# Patient Record
Sex: Female | Born: 1985 | Race: White | Hispanic: No | Marital: Married | Smoking: Former smoker
Health system: Southern US, Community
[De-identification: ages and names within clinical notes are randomized; demographics above are authoritative.]

## PROBLEM LIST (undated history)

## (undated) DIAGNOSIS — F419 Anxiety disorder, unspecified: Secondary | ICD-10-CM

## (undated) DIAGNOSIS — F429 Obsessive-compulsive disorder, unspecified: Secondary | ICD-10-CM

## (undated) DIAGNOSIS — F909 Attention-deficit hyperactivity disorder, unspecified type: Secondary | ICD-10-CM

## (undated) DIAGNOSIS — F32A Depression, unspecified: Secondary | ICD-10-CM

## (undated) HISTORY — DX: Attention-deficit hyperactivity disorder, unspecified type: F90.9

## (undated) HISTORY — DX: Anxiety disorder, unspecified: F41.9

## (undated) HISTORY — PX: TONSILLECTOMY: SUR1361

## (undated) HISTORY — DX: Obsessive-compulsive disorder, unspecified: F42.9

---

## 2020-07-01 ENCOUNTER — Encounter: Payer: Self-pay | Admitting: Emergency Medicine

## 2020-07-01 ENCOUNTER — Emergency Department
Admission: EM | Admit: 2020-07-01 | Discharge: 2020-07-01 | Disposition: A | Discharge status: Home / Self Care | Payer: Medicaid Other | Attending: Emergency Medicine | Admitting: Emergency Medicine

## 2020-07-01 ENCOUNTER — Other Ambulatory Visit: Payer: Self-pay

## 2020-07-01 ENCOUNTER — Emergency Department: Payer: Medicaid Other

## 2020-07-01 DIAGNOSIS — O200 Threatened abortion: Secondary | ICD-10-CM | POA: Diagnosis not present

## 2020-07-01 DIAGNOSIS — Z3A01 Less than 8 weeks gestation of pregnancy: Secondary | ICD-10-CM | POA: Diagnosis not present

## 2020-07-01 DIAGNOSIS — Z2913 Encounter for prophylactic Rho(D) immune globulin: Secondary | ICD-10-CM | POA: Diagnosis not present

## 2020-07-01 DIAGNOSIS — O209 Hemorrhage in early pregnancy, unspecified: Secondary | ICD-10-CM

## 2020-07-01 DIAGNOSIS — Z6741 Type O blood, Rh negative: Secondary | ICD-10-CM | POA: Diagnosis not present

## 2020-07-01 DIAGNOSIS — O208 Other hemorrhage in early pregnancy: Secondary | ICD-10-CM | POA: Diagnosis not present

## 2020-07-01 DIAGNOSIS — R1032 Left lower quadrant pain: Secondary | ICD-10-CM | POA: Diagnosis not present

## 2020-07-01 DIAGNOSIS — Z3A08 8 weeks gestation of pregnancy: Secondary | ICD-10-CM | POA: Diagnosis not present

## 2020-07-01 HISTORY — DX: Depression, unspecified: F32.A

## 2020-07-01 LAB — CBC
HCT: 34.4 % — ABNORMAL LOW (ref 36.0–46.0)
Hemoglobin: 11.3 g/dL — ABNORMAL LOW (ref 12.0–15.0)
MCH: 30.4 pg (ref 26.0–34.0)
MCHC: 32.8 g/dL (ref 30.0–36.0)
MCV: 92.5 fL (ref 80.0–100.0)
Platelets: 224 10*3/uL (ref 150–400)
RBC: 3.72 MIL/uL — ABNORMAL LOW (ref 3.87–5.11)
RDW: 13.6 % (ref 11.5–15.5)
WBC: 8.9 10*3/uL (ref 4.0–10.5)
nRBC: 0 % (ref 0.0–0.2)

## 2020-07-01 LAB — BASIC METABOLIC PANEL
Anion gap: 9 (ref 5–15)
BUN: 15 mg/dL (ref 6–20)
CO2: 23 mmol/L (ref 22–32)
Calcium: 9.2 mg/dL (ref 8.9–10.3)
Chloride: 103 mmol/L (ref 98–111)
Creatinine, Ser: 0.58 mg/dL (ref 0.44–1.00)
GFR, Estimated: >60 mL/min (ref >60)
Glucose, Bld: 92 mg/dL (ref 70–99)
Potassium: 4.1 mmol/L (ref 3.5–5.1)
Sodium: 135 mmol/L (ref 135–145)

## 2020-07-01 LAB — CHLAMYDIA/NGC RT PCR (ARMC ONLY)
Chlamydia Tr: NOT DETECTED
N gonorrhoeae: NOT DETECTED

## 2020-07-01 LAB — WET PREP, GENITAL
Sperm: NONE SEEN
Trich, Wet Prep: NONE SEEN
Yeast Wet Prep HPF POC: NONE SEEN

## 2020-07-01 LAB — URINALYSIS, ROUTINE W REFLEX MICROSCOPIC
Bacteria, UA: NONE SEEN
Bilirubin Urine: NEGATIVE
Glucose, UA: NEGATIVE mg/dL
Ketones, ur: NEGATIVE mg/dL
Leukocytes,Ua: NEGATIVE
Nitrite: NEGATIVE
Protein, ur: NEGATIVE mg/dL
Specific Gravity, Urine: 1.012 (ref 1.005–1.030)
pH: 5 (ref 5.0–8.0)

## 2020-07-01 LAB — POC URINE PREG, ED: Preg Test, Ur: POSITIVE — AB

## 2020-07-01 LAB — HCG, QUANTITATIVE, PREGNANCY: hCG, Beta Chain, Quant, S: 143460 m[IU]/mL — ABNORMAL HIGH (ref <5)

## 2020-07-01 LAB — PREGNANCY, URINE: Preg Test, Ur: POSITIVE — AB

## 2020-07-01 LAB — ABO/RH: ABO/RH(D): O NEG

## 2020-07-01 LAB — ANTIBODY SCREEN: Antibody Screen: NEGATIVE

## 2020-07-01 MED ORDER — RHO D IMMUNE GLOBULIN 1500 UNIT/2ML IJ SOSY
300.0000 ug | PREFILLED_SYRINGE | Freq: Once | INTRAMUSCULAR | Status: DC
  Filled 2020-07-01: qty 2

## 2020-07-01 MED ORDER — ONDANSETRON 4 MG PO TBDP: 4.0000 mg | ORAL_TABLET | Freq: Three times a day (TID) | ORAL | 0 refills | Status: DC | PRN

## 2020-07-01 MED ORDER — RHO D IMMUNE GLOBULIN 1500 UNIT/2ML IJ SOSY
300.0000 ug | PREFILLED_SYRINGE | Freq: Once | INTRAMUSCULAR | Status: AC
  Administered 2020-07-01: 300 ug via INTRAMUSCULAR
  Filled 2020-07-01: qty 2

## 2020-07-01 NOTE — ED Triage Notes (Signed)
Pt to ER states recent positive pregnancy test.  States this am sudden sharp pain and noted vaginal bleeding.  Pt states blood with urination and brownish discharge.  Pt states no bleeding since that time.

## 2020-07-01 NOTE — ED Provider Notes (Signed)
[facility] Emergency Department Provider Note   ____________________________________________   First MD Initiated Contact with Patient 07/01/20 [phone]     (approximate)  I have reviewed the triage vital signs and the nursing notes.   HISTORY  Chief Complaint Vaginal Bleeding    HPI Erica Ramirez is a 34 y.o. female with no significant past medical history who is G6 P2-0-3-2 at approximately 8 weeks of pregnancy and presents to the ED complaining of vaginal bleeding.  Patient reports that she found out she was pregnant about 2 weeks ago with a home pregnancy test.  This morning, she woke up with sharp pain in the left lower quadrant of her abdomen and when she went to the bathroom noticed that she was having vaginal bleeding.  She states that vaginal bleeding has seemed to ease up since then, but she did notice passage of some brownish tissue.  Abdominal pain has also improved, she denies any dysuria, hematuria, fevers, nausea, vomiting, or changes in bowel movements.  She has not yet seen an OB/GYN this pregnancy or had an ultrasound.  She reports her blood type is O- and she has required RhoGam in the past.        Past Medical History:  Diagnosis Date  . Depression     There are no problems to display for this patient.   History reviewed. No pertinent surgical history.  Prior to Admission medications   Not on File    Allergies Ibuprofen and Tramadol  History reviewed. No pertinent family history.  Social History Social History   Tobacco Use  . Smoking status: Never Smoker  . Smokeless tobacco: Never Used  Substance Use Topics  . Alcohol use: Not on file  . Drug use: Not on file    Review of Systems  Constitutional: No fever/chills Eyes: No visual changes. ENT: No sore throat. Cardiovascular: Denies chest pain. Respiratory: Denies shortness of breath. Gastrointestinal: Positive for abdominal pain.  No nausea, no vomiting.   No diarrhea.  No constipation. Genitourinary: Negative for dysuria.  Positive for vaginal bleeding. Musculoskeletal: Negative for back pain. Skin: Negative for rash. Neurological: Negative for headaches, focal weakness or numbness.  ____________________________________________   PHYSICAL EXAM:  VITAL SIGNS: ED Triage Vitals  Enc Vitals Group     BP 07/01/20 0901 (!) 111/55     Pulse Rate 07/01/20 0901 (!) 59     Resp 07/01/20 0901 16     Temp 07/01/20 0901 98.2 F (36.8 C)     Temp Source 07/01/20 0901 Oral     SpO2 07/01/20 0901 99 %     Weight 07/01/20 0900 105 lb (47.6 kg)     Height 07/01/20 0900 5\' 7"  (1.702 m)     Head Circumference --      Peak Flow --      Pain Score 07/01/20 0858 0     Pain Loc --      Pain Edu? --      Excl. in [place]? --     Constitutional: Alert and oriented. Eyes: Conjunctivae are normal. Head: Atraumatic. Nose: No congestion/rhinnorhea. Mouth/Throat: Mucous membranes are moist. Neck: Normal ROM Cardiovascular: Normal rate, regular rhythm. Grossly normal heart sounds. Respiratory: Normal respiratory effort.  No retractions. Lungs CTAB. Gastrointestinal: Soft and nontender. No distention. Genitourinary: Cervical os closed, no bleeding or discharge noted, no cervical motion or adnexal tenderness. Musculoskeletal: No lower extremity tenderness nor edema. Neurologic:  Normal speech and language. No gross focal neurologic deficits are  appreciated. Skin:  Skin is warm, dry and intact. No rash noted. Psychiatric: Mood and affect are normal. Speech and behavior are normal.  ____________________________________________   LABS (all labs ordered are listed, but only abnormal results are displayed)  Labs Reviewed  WET PREP, GENITAL - Abnormal; Notable for the following components:      Result Value   Clue Cells Wet Prep HPF POC PRESENT (*)    WBC, Wet Prep HPF POC MODERATE (*)    All other components within normal limits  CBC - Abnormal; Notable  for the following components:   RBC 3.72 (*)    Hemoglobin 11.3 (*)    HCT 34.4 (*)    All other components within normal limits  URINALYSIS, ROUTINE W REFLEX MICROSCOPIC - Abnormal; Notable for the following components:   Color, Urine YELLOW (*)    APPearance CLEAR (*)    Hgb urine dipstick SMALL (*)    All other components within normal limits  PREGNANCY, URINE - Abnormal; Notable for the following components:   Preg Test, Ur POSITIVE (*)    All other components within normal limits  HCG, QUANTITATIVE, PREGNANCY - Abnormal; Notable for the following components:   hCG, Beta Chain, Quant, S 143,460 (*)    All other components within normal limits  CHLAMYDIA/NGC RT PCR ([facility] ONLY)  BASIC METABOLIC PANEL  POC URINE PREG, ED  POC URINE PREG, ED  ABO/RH  ANTIBODY SCREEN  RHOGAM INJECTION    PROCEDURES  Procedure(s) performed (including Critical Care):  Procedures   ____________________________________________   INITIAL IMPRESSION / ASSESSMENT AND PLAN / ED COURSE       34 year old female, G6 P2-0-3-2 at approximately 8 weeks of pregnancy, presents to the ED complaining of abdominal pain and vaginal bleeding starting this morning after she found out she was pregnant [redacted] weeks ago.  Patient has no focal tenderness on abdominal exam, we will further assess with pelvic exam.  Beta hCG levels are pending and we will further assess with ultrasound for ectopic pregnancy versus miscarriage.  Labs and UA are unremarkable thus far, patient states pain has resolved.  We will confirm her Rh- status, anticipate treatment with RhoGam.  Ultrasound shows intrauterine pregnancy at approximately 8 weeks with small subchorionic hemorrhage.  This is consistent with a threatened miscarriage, patient has not had any recurrence of pain or bleeding since arrival in the ED.  We will treat with RhoGam given her Rh- status, after which she will be appropriate for discharge home.  She was counseled  to establish care with OB/GYN and to return to the ED for new worsening symptoms.  Patient agrees with plan.      ____________________________________________   FINAL CLINICAL IMPRESSION(S) / ED DIAGNOSES  Final diagnoses:  Threatened miscarriage     ED Discharge Orders    None       Note:  This document was prepared using Dragon voice recognition software and may include unintentional dictation errors.   Blake Divine, MD 07/01/20 1331

## 2020-07-02 LAB — RHOGAM INJECTION: Unit division: 0

## 2020-07-03 ENCOUNTER — Other Ambulatory Visit: Payer: Self-pay

## 2020-07-03 ENCOUNTER — Encounter: Payer: Self-pay | Admitting: Obstetrics and Gynecology

## 2020-07-03 ENCOUNTER — Ambulatory Visit (INDEPENDENT_AMBULATORY_CARE_PROVIDER_SITE_OTHER): Payer: Medicaid Other | Admitting: Obstetrics and Gynecology

## 2020-07-03 VITALS — BP 98/63 | Ht 67.0 in | Wt 113.4 lb

## 2020-07-03 DIAGNOSIS — O200 Threatened abortion: Secondary | ICD-10-CM | POA: Diagnosis not present

## 2020-07-03 DIAGNOSIS — F32A Depression, unspecified: Secondary | ICD-10-CM

## 2020-07-03 DIAGNOSIS — F419 Anxiety disorder, unspecified: Secondary | ICD-10-CM

## 2020-07-03 NOTE — Progress Notes (Signed)
Obstetrics & Gynecology Office Visit   Chief Complaint  Patient presents with  . Gynecologic Exam    follow up ER threaten miscarriage    History of Present Illness: 34 y.o. [id number] at about 100w4d gestation (by 8 week u/s on 07/02/20) who presents in follow up from a visit to the ER. She had vaginal bleeding as her reason for presenting. She received rhogam due to her Rh negative status. She had a mucus discharge that was blood tinged. When she went to urinate she noted a pure stream of blood.  Then the bleeding subsided.  She has had pain on her left lower quadrant that was a straight, shooting sharp pain. The pain didn't last long at all. She has had no more bleeding.  She had an ultrasound in the ER that didn't show any cysts on her ovaries.  She also had some left shoulder pain after the ER.  Her appetite is very low right now.  She has trouble with eating in that she doesn't want to eat. She does drink water and no other liquids. She is eating fruits and vegetables. She is not taking a PNV, she has never taken them. She is taking Zofran for nausea. She was using marijuana to treat her depression in place of [place] for her depression. The medication made her dizzy.    Past Medical History:  Diagnosis Date  . ADHD   . Anxiety   . Depression   . OCD (obsessive compulsive disorder)     Past Surgical History:  Procedure Laterality Date  . TONSILLECTOMY      Gynecologic History: Patient's last menstrual period was 05/02/2020.  Obstetric History: G1P0  Family History  Problem Relation Age of Onset  . Lung cancer Maternal Grandmother   . Colon cancer Mother   . Ovarian cancer Sister        unsure if actually diagnosed    Social History   Socioeconomic History  . Marital status: Married    Spouse name: Not on file  . Number of children: Not on file  . Years of education: Not on file  . Highest education level: Not on file  Occupational History  . Not on file  Tobacco  Use  . Smoking status: Former Smoker    Types: Cigarettes  . Smokeless tobacco: Never Used  Vaping Use  . Vaping Use: Never used  Substance and Sexual Activity  . Alcohol use: Not Currently  . Drug use: Not Currently  . Sexual activity: Yes    Birth control/protection: None  Other Topics Concern  . Not on file  Social History Narrative  . Not on file   Social Determinants of Health   Financial Resource Strain:   . Difficulty of Paying Living Expenses: Not on file  Food Insecurity:   . Worried About Charity fundraiser in the Last Year: Not on file  . Ran Out of Food in the Last Year: Not on file  Transportation Needs:   . Lack of Transportation (Medical): Not on file  . Lack of Transportation (Non-Medical): Not on file  Physical Activity:   . Days of Exercise per Week: Not on file  . Minutes of Exercise per Session: Not on file  Stress:   . Feeling of Stress : Not on file  Social Connections:   . Frequency of Communication with Friends and Family: Not on file  . Frequency of Social Gatherings with Friends and Family: Not on file  . Attends Religious  Services: Not on file  . Active Member of Clubs or Organizations: Not on file  . Attends Archivist Meetings: Not on file  . Marital Status: Not on file  Intimate Partner Violence:   . Fear of Current or Ex-Partner: Not on file  . Emotionally Abused: Not on file  . Physically Abused: Not on file  . Sexually Abused: Not on file    Allergies  Allergen Reactions  . Ibuprofen Other (See Comments)    Increased bleeding from external hemr.  . Tramadol Itching    Prior to Admission medications   Medication Sig Start Date End Date Taking? Authorizing Provider  ondansetron (ZOFRAN ODT) 4 MG disintegrating tablet Take 1 tablet (4 mg total) by mouth every 8 (eight) hours as needed for nausea or vomiting. 07/01/20   Blake Divine, MD    Review of Systems  Constitutional: Negative.   HENT: Negative.   Eyes:  Negative.   Respiratory: Negative.   Cardiovascular: Negative.   Gastrointestinal: Negative.   Genitourinary: Negative.   Musculoskeletal: Negative.   Skin: Negative.   Neurological: Negative.   Psychiatric/Behavioral: Negative.      Physical Exam BP 98/63   Ht 5\' 7"  (1.702 m)   Wt 113 lb 6.4 oz (51.4 kg)   LMP 05/02/2020   BMI 17.76 kg/m  Patient's last menstrual period was 05/02/2020. Physical Exam Constitutional:      General: She is not in acute distress.    Appearance: Normal appearance.  HENT:     Head: Normocephalic and atraumatic.  Eyes:     General: No scleral icterus.    Conjunctiva/sclera: Conjunctivae normal.  Neurological:     General: No focal deficit present.     Mental Status: She is alert and oriented to person, place, and time.     Cranial Nerves: No cranial nerve deficit.  Psychiatric:        Mood and Affect: Mood normal.        Behavior: Behavior normal.        Judgment: Judgment normal.    Female chaperone present for pelvic and breast  portions of the physical exam  Assessment: 34 y.o. G1P0 female here for  1. Threatened abortion   2. Anxiety and depression      Plan: Problem List Items Addressed This Visit    None    Visit Diagnoses    Threatened abortion    -  Primary   Relevant Orders   US OB Comp Less 14 Wks   Ambulatory referral to Psychiatry   Anxiety and depression       Relevant Orders   Ambulatory referral to Psychiatry     Threatened abortion: First trimester bleeding - incidence and clinical course of first trimester bleeding is discussed in detail with the patient today.  Approximately 1/3 of pregnancies ending in live births experienced 1st trimester bleeding.  The amount of bleeding is variable and not necessarily predictive of outcome.  Sources may be cervical or uterine.  Subchorionic hemorrhages are a frequent concurrent findings on ultrasound and are followed expectantly.  These often absorb or regress spontaneously  although risk for expansion and further disruption of the utero-placental interface leading to miscarriage is possible.  There is no clearly documented benefit to limiting or modifying activity and sexual intercourse in altering clinic course of 1st trimester bleeding.   Will get her set up for prenatal care and will get an ultrasound as part of that care.   She does have  anxiety and depression. She has taken [place] and another medication during pregnancy. She has tried Zoloft in the past, which led to suicidal thoughts.  She has also tried Xanax, which she didn't like due to loss of memory. She had access to therapy when she lived in [place].  She went to [facility] in [place]. She might try to get back in with that organization.    A total of 30 minutes were spent face-to-face with the patient as well as preparation, review, communication, and documentation during this encounter.    Prentice Docker, MD 07/03/2020 9:48 AM

## 2020-07-11 ENCOUNTER — Other Ambulatory Visit: Payer: Self-pay

## 2020-07-11 ENCOUNTER — Encounter: Payer: Self-pay | Admitting: Advanced Practice Midwife

## 2020-07-11 ENCOUNTER — Ambulatory Visit (INDEPENDENT_AMBULATORY_CARE_PROVIDER_SITE_OTHER): Payer: Medicaid Other | Admitting: Advanced Practice Midwife

## 2020-07-11 ENCOUNTER — Other Ambulatory Visit (HOSPITAL_COMMUNITY)
Admission: RE | Admit: 2020-07-11 | Discharge: 2020-07-11 | Disposition: A | Discharge status: Home / Self Care | Payer: Medicaid Other | Source: Ambulatory Visit | Attending: Advanced Practice Midwife | Admitting: Advanced Practice Midwife

## 2020-07-11 ENCOUNTER — Ambulatory Visit (INDEPENDENT_AMBULATORY_CARE_PROVIDER_SITE_OTHER): Payer: Medicaid Other

## 2020-07-11 VITALS — BP 100/60 | Wt 121.0 lb

## 2020-07-11 DIAGNOSIS — O26891 Other specified pregnancy related conditions, first trimester: Secondary | ICD-10-CM | POA: Insufficient documentation

## 2020-07-11 DIAGNOSIS — Z3480 Encounter for supervision of other normal pregnancy, unspecified trimester: Secondary | ICD-10-CM | POA: Insufficient documentation

## 2020-07-11 DIAGNOSIS — Z113 Encounter for screening for infections with a predominantly sexual mode of transmission: Secondary | ICD-10-CM | POA: Diagnosis not present

## 2020-07-11 DIAGNOSIS — Z3A09 9 weeks gestation of pregnancy: Secondary | ICD-10-CM

## 2020-07-11 DIAGNOSIS — Z3481 Encounter for supervision of other normal pregnancy, first trimester: Secondary | ICD-10-CM | POA: Diagnosis not present

## 2020-07-11 DIAGNOSIS — O200 Threatened abortion: Secondary | ICD-10-CM | POA: Diagnosis not present

## 2020-07-11 DIAGNOSIS — Z3402 Encounter for supervision of normal first pregnancy, second trimester: Secondary | ICD-10-CM | POA: Insufficient documentation

## 2020-07-11 DIAGNOSIS — Z3491 Encounter for supervision of normal pregnancy, unspecified, first trimester: Secondary | ICD-10-CM | POA: Insufficient documentation

## 2020-07-11 LAB — POCT URINALYSIS DIPSTICK OB
Glucose, UA: NEGATIVE
POC,PROTEIN,UA: NEGATIVE

## 2020-07-11 NOTE — Progress Notes (Signed)
NOB/Dating, Viaility Scan- no new concerns since last visit

## 2020-07-11 NOTE — Patient Instructions (Signed)
Exercise During Pregnancy Exercise is an important part of being healthy for people of all ages. Exercise improves the function of your heart and lungs and helps you maintain strength, flexibility, and a healthy body weight. Exercise also boosts energy levels and elevates mood. Most women should exercise regularly during pregnancy. In rare cases, women with certain medical conditions or complications may be asked to limit or avoid exercise during pregnancy. How does this affect me? Along with maintaining general strength and flexibility, exercising during pregnancy can help:  Keep strength in muscles that are used during labor and childbirth.  Decrease low back pain.  Reduce symptoms of depression.  Control weight gain during pregnancy.  Reduce the risk of needing insulin if you develop diabetes during pregnancy.  Decrease the risk of cesarean delivery.  Speed up your recovery after giving birth. How does this affect my baby? Exercise can help you have a healthy pregnancy. Exercise does not cause premature birth. It will not cause your baby to weigh less at birth. What exercises can I do? Many exercises are safe for you to do during pregnancy. Do a variety of exercises that safely increase your heart and breathing rates and help you build and maintain muscle strength. Do exercises exactly as told by your health care provider. You may do these exercises:  Walking or hiking.  Swimming.  Water aerobics.  Riding a stationary bike.  Strength training.  Modified yoga or Pilates. Tell your instructor that you are pregnant. Avoid overstretching, and avoid lying on your back for long periods of time.  Running or jogging. Only choose this type of exercise if you: ? Ran or jogged regularly before your pregnancy. ? Can run or jog and still talk in complete sentences. What exercises should I avoid? Depending on your level of fitness and whether you exercised regularly before your  pregnancy, you may be told to limit high-intensity exercise. You can tell that you are exercising at a high intensity if you are breathing much harder and faster and cannot hold a conversation while exercising. You must avoid:  Contact sports.  Activities that put you at risk for falling on or being hit in the belly, such as downhill skiing, water skiing, surfing, rock climbing, cycling, gymnastics, and horseback riding.  Scuba diving.  Skydiving.  Yoga or Pilates in a room that is heated to high temperatures.  Jogging or running, unless you ran or jogged regularly before your pregnancy. While jogging or running, you should always be able to talk in full sentences. Do not run or jog so fast that you are unable to have a conversation.  Do not exercise at more than 6,000 feet above sea level (high elevation) if you are not used to exercising at high elevation. How do I exercise in a safe way?   Avoid overheating. Do not exercise in very high temperatures.  Wear loose-fitting, breathable clothes.  Avoid dehydration. Drink enough water before, during, and after exercise to keep your urine pale yellow.  Avoid overstretching. Because of hormone changes during pregnancy, it is easy to overstretch muscles, tendons, and ligaments during pregnancy.  Start slowly and ask your health care provider to recommend the types of exercise that are safe for you.  Do not exercise to lose weight. Follow these instructions at home:  Exercise on most days or all days of the week. Try to exercise for 30 minutes a day, 5 days a week, unless your health care provider tells you not to.  If  you actively exercised before your pregnancy and you are healthy, your health care provider may tell you to continue to do moderate to high-intensity exercise.  If you are just starting to exercise or did not exercise much before your pregnancy, your health care provider may tell you to do low to moderate-intensity  exercise. Questions to ask your health care provider  Is exercise safe for me?  What are signs that I should stop exercising?  Does my health condition mean that I should not exercise during pregnancy?  When should I avoid exercising during pregnancy? Stop exercising and contact a health care provider if: You have any unusual symptoms, such as:  Mild contractions of the uterus or cramps in the abdomen.  Dizziness that does not go away when you rest. Stop exercising and get help right away if: You have any unusual symptoms, such as:  Sudden, severe pain in your low back or your belly.  Mild contractions of the uterus or cramps in the abdomen that do not improve with rest and drinking fluids.  Chest pain.  Bleeding or fluid leaking from your vagina.  Shortness of breath. These symptoms may represent a serious problem that is an emergency. Do not wait to see if the symptoms will go away. Get medical help right away. Call your local emergency services (911 in the [place] Do not drive yourself to the hospital. Summary  Most women should exercise regularly throughout pregnancy. In rare cases, women with certain medical conditions or complications may be asked to limit or avoid exercise during pregnancy.  Do not exercise to lose weight during pregnancy.  Your health care provider will tell you what level of physical activity is right for you.  Stop exercising and contact a health care provider if you have mild contractions of the uterus or cramps in the abdomen. Get help right away if these contractions or cramps do not improve with rest and drinking fluids.  Stop exercising and get help right away if you have sudden, severe pain in your low back or belly, chest pain, shortness of breath, or bleeding or leaking of fluid from your vagina. This information is not intended to replace advice given to you by your health care provider. Make sure you discuss any questions you have with your  health care provider. Document Revised: 11/10/2018 Document Reviewed: 08/24/2018 Elsevier Patient Education  2020 [place] for Pregnant Women While you are pregnant, your body requires additional nutrition to help support your growing baby. You also have a higher need for some vitamins and minerals, such as folic acid, calcium, iron, and vitamin D. Eating a healthy, well-balanced diet is very important for your health and your baby's health. Your need for extra calories varies for the three 105-month segments of your pregnancy (trimesters). For most women, it is recommended to consume:  150 extra calories a day during the first trimester.  300 extra calories a day during the second trimester.  300 extra calories a day during the third trimester. What are tips for following this plan?   Do not try to lose weight or go on a diet during pregnancy.  Limit your overall intake of foods that have "empty calories." These are foods that have little nutritional value, such as sweets, desserts, candies, and sugar-sweetened beverages.  Eat a variety of foods (especially fruits and vegetables) to get a full range of vitamins and minerals.  Take a prenatal vitamin to help meet your additional vitamin and mineral needs  during pregnancy, specifically for folic acid, iron, calcium, and vitamin D.  Remember to stay active. Ask your health care provider what types of exercise and activities are safe for you.  Practice good food safety and cleanliness. Wash your hands before you eat and after you prepare raw meat. Wash all fruits and vegetables well before peeling or eating. Taking these actions can help to prevent food-borne illnesses that can be very dangerous to your baby, such as listeriosis. Ask your health care provider for more information about listeriosis. What does 150 extra calories look like? Healthy options that provide 150 extra calories each day could be any of the  following:  6-8 oz (170-230 g) of plain low-fat yogurt with  cup of berries.  1 apple with 2 teaspoons (11 g) of peanut butter.  Cut-up vegetables with  cup (60 g) of hummus.  8 oz (230 mL) or 1 cup of low-fat chocolate milk.  1 stick of string cheese with 1 medium orange.  1 peanut butter and jelly sandwich that is made with one slice of whole-wheat bread and 1 tsp (5 g) of peanut butter. For 300 extra calories, you could eat two of those healthy options each day. What is a healthy amount of weight to gain? The right amount of weight gain for you is based on your BMI before you became pregnant. If your BMI:  Was less than 18 (underweight), you should gain 28-40 lb (13-18 kg).  Was 18-24.9 (normal), you should gain 25-35 lb (11-16 kg).  Was 25-29.9 (overweight), you should gain 15-25 lb (7-11 kg).  Was 30 or greater (obese), you should gain 11-20 lb (5-9 kg). What if I am having twins or multiples? Generally, if you are carrying twins or multiples:  You may need to eat 300-600 extra calories a day.  The recommended range for total weight gain is 25-54 lb (11-25 kg), depending on your BMI before pregnancy.  Talk with your health care provider to find out about nutritional needs, weight gain, and exercise that is right for you. What foods can I eat?  Fruits All fruits. Eat a variety of colors and types of fruit. Remember to wash your fruits well before peeling or eating. Vegetables All vegetables. Eat a variety of colors and types of vegetables. Remember to wash your vegetables well before peeling or eating. Grains All grains. Choose whole grains, such as whole-wheat bread, oatmeal, or brown rice. Meats and other protein foods Lean meats, including chicken, [place], fish, and lean cuts of beef, veal, or pork. If you eat fish or seafood, choose options that are higher in omega-3 fatty acids and lower in mercury, such as salmon, herring, mussels, trout, sardines, pollock,  shrimp, crab, and lobster. Tofu. Tempeh. Beans. Eggs. Peanut butter and other nut butters. Make sure that all meats, poultry, and eggs are cooked to food-safe temperatures or "well-done." Two or more servings of fish are recommended each week in order to get the most benefits from omega-3 fatty acids that are found in seafood. Choose fish that are lower in mercury. You can find more information online:  [employer]aming[employer]ch Dairy Pasteurized milk and milk alternatives (such as almond milk). Pasteurized yogurt and pasteurized cheese. Cottage cheese. Sour cream. Beverages Water. Juices that contain 100% fruit juice or vegetable juice. Caffeine-free teas and decaffeinated coffee. Drinks that contain caffeine are okay to drink, but it is better to avoid caffeine. Keep your total caffeine intake to less than 200 mg each day (which is 12 oz  or 355 mL of coffee, tea, or soda) or the limit as told by your health care provider. Fats and oils Fats and oils are okay to include in moderation. Sweets and desserts Sweets and desserts are okay to include in moderation. Seasoning and other foods All pasteurized condiments. The items listed above may not be a complete list of foods and beverages you can eat. Contact a dietitian for more information. What foods are not recommended? Fruits Unpasteurized fruit juices. Vegetables Raw (unpasteurized) vegetable juices. Meats and other protein foods Lunch meats, bologna, hot dogs, or other deli meats. (If you must eat those meats, reheat them until they are steaming hot.) Refrigerated pat, meat spreads from a meat counter, smoked seafood that is found in the refrigerated section of a store. Raw or undercooked meats, poultry, and eggs. Raw fish, such as sushi or sashimi. Fish that have high mercury content, such as tilefish, shark, swordfish, and king mackerel. To learn more about mercury in fish, talk with your health care provider or look for online resources, such  as:  [employer]ch Dairy Raw (unpasteurized) milk and any foods that have raw milk in them. Soft cheeses, such as feta, queso blanco, queso fresco, Brie, Camembert cheeses, blue-veined cheeses, and Panela cheese (unless it is made with pasteurized milk, which must be stated on the label). Beverages Alcohol. Sugar-sweetened beverages, such as sodas, teas, or energy drinks. Seasoning and other foods Homemade fermented foods and drinks, such as pickles, sauerkraut, or kombucha drinks. (Store-bought pasteurized versions of these are okay.) Salads that are made in a store or deli, such as ham salad, chicken salad, egg salad, tuna salad, and seafood salad. The items listed above may not be a complete list of foods and beverages you should avoid. Contact a dietitian for more information. Where to find more information To calculate the number of calories you need based on your height, weight, and activity level, you can use an online calculator such as:  MobileTransition.ch To calculate how much weight you should gain during pregnancy, you can use an online pregnancy weight gain calculator such as:  StreamingFood.com.cy Summary  While you are pregnant, your body requires additional nutrition to help support your growing baby.  Eat a variety of foods, especially fruits and vegetables to get a full range of vitamins and minerals.  Practice good food safety and cleanliness. Wash your hands before you eat and after you prepare raw meat. Wash all fruits and vegetables well before peeling or eating. Taking these actions can help to prevent food-borne illnesses, such as listeriosis, that can be very dangerous to your baby.  Do not eat raw meat or fish. Do not eat fish that have high mercury content, such as tilefish, shark, swordfish, and king mackerel. Do not eat unpasteurized (raw) dairy.  Take a prenatal vitamin to help meet your additional vitamin and  mineral needs during pregnancy, specifically for folic acid, iron, calcium, and vitamin D. This information is not intended to replace advice given to you by your health care provider. Make sure you discuss any questions you have with your health care provider. Document Revised: 12/08/2018 Document Reviewed: 04/16/2017 Elsevier Patient Education  2020 [employer]. Prenatal Care Prenatal care is health care during pregnancy. It helps you and your unborn baby (fetus) stay as healthy as possible. Prenatal care may be provided by a midwife, a family practice health care provider, or a childbirth and pregnancy specialist (obstetrician). How does this affect me? During pregnancy, you will be closely monitored  for any new conditions that might develop. To lower your risk of pregnancy complications, you and your health care provider will talk about any underlying conditions you have. How does this affect my baby? Early and consistent prenatal care increases the chance that your baby will be healthy during pregnancy. Prenatal care lowers the risk that your baby will be:  Born early (prematurely).  Smaller than expected at birth (small for gestational age). What can I expect at the first prenatal care visit? Your first prenatal care visit will likely be the longest. You should schedule your first prenatal care visit as soon as you know that you are pregnant. Your first visit is a good time to talk about any questions or concerns you have about pregnancy. At your visit, you and your health care provider will talk about:  Your medical history, including: ? Any past pregnancies. ? Your family's medical history. ? The baby's father's medical history. ? Any long-term (chronic) health conditions you have and how you manage them. ? Any surgeries or procedures you have had. ? Any current over-the-counter or prescription medicines, herbs, or supplements you are taking.  Other factors that could pose a risk  to your baby, including:  Your home setting and your stress levels, including: ? Exposure to abuse or violence. ? Household financial strain. ? Mental health conditions you have.  Your daily health habits, including diet and exercise. Your health care provider will also:  Measure your weight, height, and blood pressure.  Do a physical exam, including a pelvic and breast exam.  Perform blood tests and urine tests to check for: ? Urinary tract infection. ? Sexually transmitted infections (STIs). ? Low iron levels in your blood (anemia). ? Blood type and certain proteins on red blood cells (Rh antibodies). ? Infections and immunity to viruses, such as hepatitis B and rubella. ? HIV (human immunodeficiency virus).  Do an ultrasound to confirm your baby's growth and development and to help predict your estimated due date (EDD). This ultrasound is done with a probe that is inserted into the vagina (transvaginal ultrasound).  Discuss your options for genetic screening.  Give you information about how to keep yourself and your baby healthy, including: ? Nutrition and taking vitamins. ? Physical activity. ? How to manage pregnancy symptoms such as nausea and vomiting (morning sickness). ? Infections and substances that may be harmful to your baby and how to avoid them. ? Food safety. ? Dental care. ? Working. ? Travel. ? Warning signs to watch for and when to call your health care provider. How often will I have prenatal care visits? After your first prenatal care visit, you will have regular visits throughout your pregnancy. The visit schedule is often as follows:  Up to week 28 of pregnancy: once every 4 weeks.  28-36 weeks: once every 2 weeks.  After 36 weeks: every week until delivery. Some women may have visits more or less often depending on any underlying health conditions and the health of the baby. Keep all follow-up and prenatal care visits as told by your health care  provider. This is important. What happens during routine prenatal care visits? Your health care provider will:  Measure your weight and blood pressure.  Check for fetal heart sounds.  Measure the height of your uterus in your abdomen (fundal height). This may be measured starting around week 20 of pregnancy.  Check the position of your baby inside your uterus.  Ask questions about your diet, sleeping patterns, and  whether you can feel the baby move.  Review warning signs to watch for and signs of labor.  Ask about any pregnancy symptoms you are having and how you are dealing with them. Symptoms may include: ? Headaches. ? Nausea and vomiting. ? Vaginal discharge. ? Swelling. ? Fatigue. ? Constipation. ? Any discomfort, including back or pelvic pain. Make a list of questions to ask your health care provider at your routine visits. What tests might I have during prenatal care visits? You may have blood, urine, and imaging tests throughout your pregnancy, such as:  Urine tests to check for glucose, protein, or signs of infection.  Glucose tests to check for a form of diabetes that can develop during pregnancy (gestational diabetes mellitus). This is usually done around week 24 of pregnancy.  An ultrasound to check your baby's growth and development and to check for birth defects. This is usually done around week 20 of pregnancy.  A test to check for group B strep (GBS) infection. This is usually done around week 36 of pregnancy.  Genetic testing. This may include blood or imaging tests, such as an ultrasound. Some genetic tests are done during the first trimester and some are done during the second trimester. What else can I expect during prenatal care visits? Your health care provider may recommend getting certain vaccines during pregnancy. These may include:  A yearly flu shot (annual influenza vaccine). This is especially important if you will be pregnant during flu  season.  Tdap (tetanus, diphtheria, pertussis) vaccine. Getting this vaccine during pregnancy can protect your baby from whooping cough (pertussis) after birth. This vaccine may be recommended between weeks 27 and 36 of pregnancy. Later in your pregnancy, your health care provider may give you information about:  Childbirth and breastfeeding classes.  Choosing a health care provider for your baby.  Umbilical cord banking.  Breastfeeding.  Birth control after your baby is born.  The hospital labor and delivery unit and how to tour it.  Registering at the hospital before you go into labor. Where to find more information  Office on Women's Health: LegalWarrants.gl  American Pregnancy Association: americanpregnancy.org  March of Dimes: marchofdimes.org Summary  Prenatal care helps you and your baby stay as healthy as possible during pregnancy.  Your first prenatal care visit will most likely be the longest.  You will have visits and tests throughout your pregnancy to monitor your health and your baby's health.  Bring a list of questions to your visits to ask your health care provider.  Make sure to keep all follow-up and prenatal care visits with your health care provider. This information is not intended to replace advice given to you by your health care provider. Make sure you discuss any questions you have with your health care provider. Document Revised: 11/09/2018 Document Reviewed: 07/19/2017 Elsevier Patient Education  [place].

## 2020-07-12 NOTE — Progress Notes (Signed)
New Obstetric Patient H&P  Date pf Visit: 07/11/2020  Chief Complaint: "Desires prenatal care"   History of Present Illness: Patient is a 34 y.o. [id number] Not Hispanic or Latino female, presents with amenorrhea and positive home pregnancy test. Patient's last menstrual period was 05/02/2020 (within weeks). and based on 9 week ultrasound, her EDD is Estimated Date of Delivery: 02/09/21 and her EGA is [redacted]w[redacted]d. Cycles are 5-7 days, regular, and occur approximately every : 28 days. Her last pap smear was 3 years ago and was no abnormalities.    She had a urine pregnancy test which was positive 3 week(s)  ago. Her last menstrual period was normal and lasted for  5 or 6 day(s). Since her LMP she claims she has experienced breast tenderness, fatigue, nausea. She had an episode of vaginal bleeding and received Rhogam on 07/01/20. Her past medical history is contributory for anger and anxiety related to past trauma. She requested medication and therapy assistance. We will refer her to either [facility] or [facility]. 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  Since her LMP, she admits to the use of tobacco products  no She claims she has gained 11 pounds since the start of her pregnancy.  There are cats in the home in the home  yes her husband changes litterbox She admits close contact with children on a regular basis  yes  She has had chicken pox in the past unknown She has had Tuberculosis exposures, symptoms, or previously tested positive for TB   no Current or past history of domestic violence. no  Genetic Screening/Teratology Counseling: (Includes patient, baby's father, or anyone in either family with:)   31. Patient's age >/= 93 at [facility]  no 2. Thalassemia ([place], [place], [place], or Asian background): MCV<80  no 3. Neural tube defect (meningomyelocele, spina bifida, anencephaly)  no 4. Congenital heart defect  no  5.  Down syndrome  no 6. Tay-Sachs (Jewish, [place])  no 7. Canavan's Disease  no 8. Sickle cell disease or trait (African)  no  9. Hemophilia or other blood disorders  no  10. Muscular dystrophy  no  11. Cystic fibrosis  no  12. Huntington's Chorea  no  13. Mental retardation/autism  FOB brother with autism 74. Other inherited genetic or chromosomal disorder  no 15. Maternal metabolic disorder (DM, PKU, etc)  no 16. Patient or FOB with a child with a birth defect not listed above no  16a. Patient or FOB with a birth defect themselves no 17. Recurrent pregnancy loss, or stillbirth  no  18. Any medications since LMP other than prenatal vitamins (include vitamins, supplements, OTC meds, drugs, alcohol)  no 19. Any other genetic/environmental exposure to discuss  no  Infection History:   1. Lives with someone with TB or TB exposed  no  2. Patient or partner has history of genital herpes  no 3. Rash or viral illness since LMP  no 4. History of STI (GC, CT, HPV, syphilis, HIV)  Yes remote hx CT 5. History of recent travel :  no  Other pertinent information:  No    Review of Systems:10 point review of systems negative unless otherwise noted in HPI  Past Medical History:  Patient Active Problem List   Diagnosis Date Noted  . Supervision of normal pregnancy in first trimester 07/11/2020    [facility]inic [facility] Prenatal Labs  Dating EDD by 9w u/s Blood type: --/--/O  NEG Performed at [facility], [address] [place], [place] [address]  [phone])   Genetic Screen 1 Screen:    AFP:     Quad:     NIPS: Antibody:NEG Performed at [facility], [address] [place],  [address]  [phone])  Anatomic [place]  Rubella:    Varicella: @VZVIGG @  GTT Early: NA              Third trimester:  RPR:     Rhogam  HBsAg:     Vaccines TDAP:                       Flu Shot: Covid: HIV:     Baby Food Breast                               GBS:   GC/CT:   Contraception  Pap: 2018 negative (5 yr interval)  CBB     CS/VBAC NA   Support Person Mitzi Hansen         Past Surgical History:  Past Surgical History:  Procedure Laterality Date  . TONSILLECTOMY      Gynecologic History: Patient's last menstrual period was 05/02/2020 (within weeks).  Obstetric History: [id number]  Family History:  Family History  Problem Relation Age of Onset  . Lung cancer Maternal Grandmother   . Colon cancer Mother   . Ovarian cancer Sister        unsure if actually diagnosed    Social History:  Social History   Socioeconomic History  . Marital status: Married    Spouse name: Not on file  . Number of children: Not on file  . Years of education: Not on file  . Highest education level: Not on file  Occupational History  . Not on file  Tobacco Use  . Smoking status: Former Smoker    Types: Cigarettes  . Smokeless tobacco: Never Used  Vaping Use  . Vaping Use: Never used  Substance and Sexual Activity  . Alcohol use: Not Currently  . Drug use: Not Currently  . Sexual activity: Yes    Birth control/protection: None  Other Topics Concern  . Not on file  Social History Narrative  . Not on file   Social Determinants of Health   Financial Resource Strain: Not on file  Food Insecurity: Not on file  Transportation Needs: Not on file  Physical Activity: Not on file  Stress: Not on file  Social Connections: Not on file  Intimate Partner Violence: Not on file    Allergies:  Allergies  Allergen Reactions  . Ibuprofen Other (See Comments)    Increased bleeding from external hemr.  . Tramadol Itching    Medications: Prior to Admission medications   Medication Sig Start Date End Date Taking? Authorizing Provider  ondansetron (ZOFRAN ODT) 4 MG disintegrating tablet Take 1 tablet (4 mg total) by mouth every 8 (eight) hours as needed for nausea or vomiting. 07/01/20  Yes Blake Divine, MD    Physical Exam Vitals: Blood pressure 100/60,  weight 121 lb (54.9 kg), last menstrual period 05/02/2020.  General: NAD HEENT: normocephalic, anicteric Thyroid: no enlargement, no palpable nodules Pulmonary: No increased work of breathing, CTAB Cardiovascular: RRR, distal pulses 2+ Abdomen: NABS, soft, non-tender, non-distended.  Umbilicus without lesions.  No hepatomegaly, splenomegaly or masses palpable. No evidence of hernia  Genitourinary:  External: Normal external female genitalia.  Normal urethral meatus, normal Bartholin's and Skene's glands.    Vagina: Normal vaginal mucosa, no evidence of prolapse.    Cervix: aptima collected  Uterus: deferred  Adnexa: deferred  Rectal: deferred, external hemorrhoid inflamed Extremities: no edema, erythema, or tenderness Neurologic: Grossly intact Psychiatric: mood appropriate, affect full  The following were addressed during this visit:  Breastfeeding Education - Early initiation of breastfeeding    Comments: Keeps milk supply adequate, helps contract uterus and slow bleeding, and early milk is the perfect first food and is easy to digest.   - The importance of exclusive breastfeeding    Comments: Provides antibodies, Lower risk of breast and ovarian cancers, and type-2 diabetes,Helps your body recover, Reduced chance of SIDS.   - Risks of giving your baby anything other than breast milk if you are breastfeeding    Comments: Make the baby less content with breastfeeds, may make my baby more susceptible to illness, and may reduce my milk supply.   - The importance of early skin-to-skin contact    Comments: Keeps baby warm and secure, helps keep baby's blood sugar up and breathing steady, easier to bond and breastfeed, and helps calm baby.  - Rooming-in on a 24-hour basis    Comments: Easier to learn baby's feeding cues, easier to bond and get to know each other, and encourages milk production.   - Feeding on demand or baby-led feeding    Comments: Helps prevent breastfeeding  complications, helps bring in good milk supply, prevents under or overfeeding, and helps baby feel content and satisfied   - Frequent feeding to help assure optimal milk production    Comments: Making a full supply of milk requires frequent removal of milk from breasts, infant will eat 8-12 times in 24 hours, if separated from infant use breast massage, hand expression and/ or pumping to remove milk from breasts.   - Effective positioning and attachment    Comments: Helps my baby to get enough breast milk, helps to produce an adequate milk supply, and helps prevent nipple pain and damage   - Exclusive breastfeeding for the first 6 months    Comments: Builds a healthy milk supply and keeps it up, protects baby from sickness and disease, and breastmilk has everything your baby needs for the first 6 months.  - Individualized Education    Comments: Contraindications to breastfeeding and other special medical conditions Patient has experience breastfeeding    Assessment: 34 y.o. [id number] at [redacted]w[redacted]d presenting to initiate prenatal care  Plan: 1) Avoid alcoholic beverages. 2) Patient encouraged not to smoke.  3) Discontinue the use of all non-medicinal drugs and chemicals.  4) Take prenatal vitamins daily.  5) Nutrition, food safety (fish, cheese advisories, and high nitrite foods) and exercise discussed. 6) Hospital and practice style discussed with cross coverage system.  7) Genetic Screening, such as with 1st Trimester Screening, cell free fetal DNA, AFP testing, and Ultrasound, as well as with amniocentesis and CVS as appropriate, is discussed with patient. At the conclusion of today's visit patient requested genetic testing 8) Patient is asked about travel to areas at risk for the Zika virus, and counseled to avoid travel and exposure to mosquitoes or sexual partners who may have themselves been exposed to the virus. Testing is discussed, and will be ordered as appropriate. 9)  Dating/viability scan, aptima, urine culture and UDS today 10) Return to clinic in 4 weeks for labs including MaterniT 21 and ROB 11) Request for referral to either [facility] or  [facility] sent to K. Colbert, [facility] 07/12/2020, 12:07 PM

## 2020-07-15 LAB — URINE CULTURE

## 2020-07-15 LAB — CERVICOVAGINAL ANCILLARY ONLY
Chlamydia: NEGATIVE
Comment: NEGATIVE
Comment: NEGATIVE
Comment: NORMAL
Neisseria Gonorrhea: NEGATIVE
Trichomonas: NEGATIVE

## 2020-07-24 LAB — URINE DRUG PANEL 7
Amphetamines, Urine: NEGATIVE ng/mL
Barbiturate Quant, Ur: NEGATIVE ng/mL
Benzodiazepine Quant, Ur: NEGATIVE ng/mL
Cannabinoid Quant, Ur: POSITIVE — AB
Cocaine (Metab.): NEGATIVE ng/mL
Opiate Quant, Ur: NEGATIVE ng/mL
PCP Quant, Ur: NEGATIVE ng/mL

## 2020-07-31 DIAGNOSIS — O98511 Other viral diseases complicating pregnancy, first trimester: Secondary | ICD-10-CM | POA: Diagnosis not present

## 2020-07-31 DIAGNOSIS — U071 COVID-19: Secondary | ICD-10-CM | POA: Diagnosis not present

## 2020-07-31 DIAGNOSIS — Z3A12 12 weeks gestation of pregnancy: Secondary | ICD-10-CM | POA: Diagnosis not present

## 2020-08-06 ENCOUNTER — Ambulatory Visit: Payer: Self-pay | Admitting: *Deleted

## 2020-08-06 ENCOUNTER — Telehealth: Payer: Self-pay

## 2020-08-06 NOTE — Telephone Encounter (Signed)
OB patient reports she was diagnosed w/Covid 07/31/20. Just started showing symptoms this morning. Requesting a call back. [phone]

## 2020-08-06 NOTE — Telephone Encounter (Signed)
Patient is aware of phone visit for 08/08/20 d/t covid positive. She does not have any further questions at this time.

## 2020-08-06 NOTE — Telephone Encounter (Signed)
 -  Please quarantine and isolate at home  for at least 10 days since symptoms started AND - At least 24 hours fever free without the use of fever reducing medications such as Tylenol or Ibuprofen AND - Improvement in respiratory symptoms Use over-the-counter medications for symptoms.If you develop respiratory issues/distress, seek medical care in the Emergency Department.  If you must leave home or if you have to be around others please wear a mask. Please limit contact with immediate family members in the home, practice social distancing, frequent handwashing and clean hard surfaces touched frequently with household cleaning products. Members of your household will also need to quarantine and test.You may also be contacted by the health department for follow up. Please call [facility] at [phone] if you have any questions or concerns.  Reason for Disposition . HIGH RISK for severe COVID complications (e.g., age > 64 years, obesity with BMI > 25, pregnant, chronic lung disease or other chronic medical condition)  (Exception: Already seen by PCP and no new or worsening symptoms.)  Answer Assessment - Initial Assessment Questions 1. COVID-19 DIAGNOSIS: "Who made your COVID-19 diagnosis?" "Was it confirmed by a positive lab test?" If not diagnosed by a HCP, ask "Are there lots of cases (community spread) where you live?" Note: See public health department website, if unsure.     ED- + COVID 2. COVID-19 EXPOSURE: "Was there any known exposure to COVID before the symptoms began?" CDC Definition of close contact: within 6 feet (2 meters) for a total of 15 minutes or more over a 24-hour period.      Patient had traveled to see family out of state 3. ONSET: "When did the COVID-19 symptoms start?"      Symptoms started last night 4. WORST SYMPTOM: "What is your worst symptom?" (e.g., cough, fever, shortness of breath, muscle aches)     Throat irritation, SOB slight 5. COUGH: "Do you have a cough?" If  Yes, ask: "How bad is the cough?"      no 6. FEVER: "Do you have a fever?" If Yes, ask: "What is your temperature, how was it measured, and when did it start?"     No fever since ED visit- 97.5 7. RESPIRATORY STATUS: "Describe your breathing?" (e.g., shortness of breath, wheezing, unable to speak)      Some SOB with talking 8. BETTER-SAME-WORSE: "Are you getting better, staying the same or getting worse compared to yesterday?"  If getting worse, ask, "In what way?"     Worse- increase in symptoms 9. HIGH RISK DISEASE: "Do you have any chronic medical problems?" (e.g., asthma, heart or lung disease, weak immune system, obesity, etc.)     pregnant 10. VACCINE: "Have you gotten the COVID-19 vaccine?" If Yes ask: "Which one, how many shots, when did you get it?"       No 11. PREGNANCY: "Is there any chance you are pregnant?" "When was your last menstrual period?"       Yes- patient is pregnant 3. OTHER SYMPTOMS: "Do you have any other symptoms?"  (e.g., chills, fatigue, headache, loss of smell or taste, muscle pain, sore throat; new loss of smell or taste especially support the diagnosis of COVID-19)       headache  Protocols used: CORONAVIRUS (COVID-19) DIAGNOSED OR SUSPECTED-A-AH

## 2020-08-08 ENCOUNTER — Encounter: Payer: Self-pay | Admitting: Obstetrics and Gynecology

## 2020-08-08 ENCOUNTER — Other Ambulatory Visit: Payer: Self-pay

## 2020-08-08 ENCOUNTER — Ambulatory Visit (INDEPENDENT_AMBULATORY_CARE_PROVIDER_SITE_OTHER): Payer: Medicaid Other | Admitting: Obstetrics and Gynecology

## 2020-08-08 VITALS — Wt 121.0 lb

## 2020-08-08 DIAGNOSIS — U071 COVID-19: Secondary | ICD-10-CM | POA: Diagnosis not present

## 2020-08-08 DIAGNOSIS — R519 Headache, unspecified: Secondary | ICD-10-CM

## 2020-08-08 DIAGNOSIS — R110 Nausea: Secondary | ICD-10-CM | POA: Diagnosis not present

## 2020-08-08 DIAGNOSIS — Z3402 Encounter for supervision of normal first pregnancy, second trimester: Secondary | ICD-10-CM

## 2020-08-08 DIAGNOSIS — Z3A13 13 weeks gestation of pregnancy: Secondary | ICD-10-CM | POA: Diagnosis not present

## 2020-08-08 DIAGNOSIS — O26891 Other specified pregnancy related conditions, first trimester: Secondary | ICD-10-CM

## 2020-08-08 DIAGNOSIS — O98511 Other viral diseases complicating pregnancy, first trimester: Secondary | ICD-10-CM | POA: Diagnosis not present

## 2020-08-08 DIAGNOSIS — O219 Vomiting of pregnancy, unspecified: Secondary | ICD-10-CM

## 2020-08-08 DIAGNOSIS — O26892 Other specified pregnancy related conditions, second trimester: Secondary | ICD-10-CM

## 2020-08-08 MED ORDER — ACETAMINOPHEN-CODEINE #3 300-30 MG PO TABS: 1.0000 | ORAL_TABLET | ORAL | 0 refills | Status: DC | PRN

## 2020-08-08 MED ORDER — ONDANSETRON 4 MG PO TBDP: 4.0000 mg | ORAL_TABLET | Freq: Four times a day (QID) | ORAL | 3 refills | Status: DC | PRN

## 2020-08-08 NOTE — Progress Notes (Signed)
Routine Prenatal Care Visit- Virtual Visit  Subjective   Virtual Visit via Telephone Note  I connected with Erica Ramirez on 08/08/20 at  4:10 PM EST by telephone and verified that I am speaking with the correct person using two identifiers.   I discussed the limitations, risks, security and privacy concerns of performing an evaluation and management service by telephone and the availability of in person appointments. I also discussed with the patient that there may be a patient responsible charge related to this service. The patient expressed understanding and agreed to proceed.  The patient was at home I spoke with the patient from my  office The names of people involved in this encounter were: Erica Ramirez and Dr. Jerene Pitch.   Erica Ramirez is a 35 y.o. [id number] at [redacted]w[redacted]d being seen today for ongoing prenatal care.  She is currently monitored for the following issues for this low-risk pregnancy and has Encounter for supervision of normal first pregnancy in second trimester on their problem list.  ----------------------------------------------------------------------------------- Patient reports she has had some mild nausea improved with zofran. She has had a migraine which has been improved with Tylenol #3.  Reports that she has been experiencing situational stress, her cousin has recently died in a car accident.  She reports mild symptoms of covid. She denies shortness or breath. She denis fevers.    Contractions: Not present. Vag. Bleeding: None.   . Denies leaking of fluid.  ----------------------------------------------------------------------------------- The following portions of the patient's history were reviewed and updated as appropriate: allergies, current medications, past family history, past medical history, past social history, past surgical history and problem list. Problem list updated.   Objective  Weight 121 lb (54.9 kg), last menstrual period  05/02/2020. Pregravid weight 110 lb (49.9 kg) Total Weight Gain 11 lb (4.99 kg) Urinalysis:      Fetal Status:           Physical Exam could not be performed. Because of the COVID-19 outbreak this visit was performed over the phone and not in person.   Assessment   35 y.o. [id number] at [redacted]w[redacted]d by  02/09/2021, by Ultrasound presenting for routine prenatal visit  Plan   Sixth Problems (from 07/11/20 to present)    Problem Noted Resolved   Encounter for supervision of normal first pregnancy in second trimester 07/11/2020 by Tresea Mall, CNM No   Overview Signed 07/12/2020 11:55 AM by Tresea Mall, CNM    [facility]inic [facility] Prenatal Labs  Dating EDD by 9w u/s Blood type: --/--/O NEG Performed at [facility], [address] [place], [place] [address]  [phone])   Genetic Screen 1 Screen:    AFP:     Quad:     NIPS: Antibody:NEG Performed at [facility], [address] [place], [place] [address]  [phone])  Anatomic [place]  Rubella:    Varicella: @VZVIGG @  GTT Early: NA              Third trimester:  RPR:     Rhogam  HBsAg:     Vaccines TDAP:                       Flu Shot: Covid: HIV:     Baby Food Breast                               GBS:   GC/CT:  Contraception  Pap: 2018  negative (5 yr interval)  CBB     CS/VBAC NA   Support Person Mitzi Hansen              Gestational age appropriate obstetric precautions including but not limited to vaginal bleeding, contractions, leaking of fluid and fetal movement were reviewed in detail with the patient.     Follow Up Instructions: Will follow up in 2 weeks for an appointment in the office, will need NOB labs   Discussed reasons to seek emergency care for severe symptoms of COVID 19.   I discussed the assessment and treatment plan with the patient. The patient was provided an opportunity to ask questions and all were answered. The patient agreed with the plan and demonstrated an understanding of the  instructions.   The patient was advised to call back or seek an in-person evaluation if the symptoms worsen or if the condition fails to improve as anticipated.  I provided 15 minutes of non-face-to-face time during this encounter.  Return in about 2 weeks (around 08/22/2020) for Rob in person.  Adrian Prows MD [facility], [facility] 08/08/2020 5:17 PM

## 2020-08-08 NOTE — Patient Instructions (Signed)

## 2020-08-08 NOTE — Progress Notes (Signed)
PHONE VISIT

## 2020-08-09 ENCOUNTER — Telehealth: Payer: Self-pay

## 2020-08-09 NOTE — Telephone Encounter (Signed)
Spoke w/patient. Advised can take Benadryl and use OTC hydrocortisone cream for itching. Report to ED if having difficulty breathing/feels like throat is swelling. She will d/c med. Requesting a non opiod, non narcotic pain med replacement.

## 2020-08-09 NOTE — Telephone Encounter (Signed)
Patient having a reaction to Tylenol #3. She has taken in the past before bed without a problem. She took it at 2 am and was unable to sleep and is itching. She does not a rash. It does make her naseas. It's getting unbearable. [phone]

## 2020-08-22 ENCOUNTER — Encounter: Payer: Medicaid Other | Admitting: Advanced Practice Midwife

## 2020-09-20 ENCOUNTER — Ambulatory Visit (INDEPENDENT_AMBULATORY_CARE_PROVIDER_SITE_OTHER): Payer: Medicaid Other | Admitting: Obstetrics

## 2020-09-20 ENCOUNTER — Other Ambulatory Visit: Payer: Self-pay

## 2020-09-20 VITALS — BP 100/70 | Wt 132.0 lb

## 2020-09-20 DIAGNOSIS — Z1379 Encounter for other screening for genetic and chromosomal anomalies: Secondary | ICD-10-CM

## 2020-09-20 DIAGNOSIS — Z113 Encounter for screening for infections with a predominantly sexual mode of transmission: Secondary | ICD-10-CM

## 2020-09-20 DIAGNOSIS — Z3481 Encounter for supervision of other normal pregnancy, first trimester: Secondary | ICD-10-CM | POA: Diagnosis not present

## 2020-09-20 DIAGNOSIS — Z3A19 19 weeks gestation of pregnancy: Secondary | ICD-10-CM

## 2020-09-20 NOTE — Progress Notes (Signed)
ROB- abdominal discomfort x 1 month, has been trouble walking for the past 2 weeks-leg pain near vag area

## 2020-09-20 NOTE — Progress Notes (Signed)
Routine Prenatal Care Visit  Subjective  Erica Ramirez is a 35 y.o. [id number] at [redacted]w[redacted]d being seen today for ongoing prenatal care.  She is currently monitored for the following issues for this low-risk pregnancy and has Encounter for supervision of normal first pregnancy in second trimester on their problem list.  ----------------------------------------------------------------------------------- Patient reports no bleeding, no contractions, no cramping, no leaking and and she has been having constipation for months and also c/o a burning sensation on her abdomen from time to time. She DNKA'ed her lst appointmtnet and before that had a virtual visit..    .  .   Jacklyn Shell Fluid denies.  ----------------------------------------------------------------------------------- The following portions of the patient's history were reviewed and updated as appropriate: allergies, current medications, past family history, past medical history, past social history, past surgical history and problem list. Problem list updated.  Objective  Blood pressure 100/70, weight 132 lb (59.9 kg), last menstrual period 05/02/2020. Pregravid weight 110 lb (49.9 kg) Total Weight Gain 22 lb (9.979 kg) Urinalysis: Urine Protein    Urine Glucose    Fetal Status:           General:  Alert, oriented and cooperative. Patient is in no acute distress.  Skin: Skin is warm and dry. No rash noted.   Cardiovascular: Normal heart rate noted  Respiratory: Normal respiratory effort, no problems with respiration noted  Abdomen: Soft, gravid, appropriate for gestational age.       Pelvic:  Cervical exam deferred        Extremities: Normal range of motion.     Mental Status: Normal mood and affect. Normal behavior. Normal judgment and thought content.   Assessment   35 y.o. [id number] at [redacted]w[redacted]d by  02/09/2021, by Ultrasound presenting for routine prenatal visit  Plan   Sixth Problems (from 07/11/20 to present)    Problem Noted  Resolved   Encounter for supervision of normal first pregnancy in second trimester 07/11/2020 by Rod Can, CNM No   Overview Addendum 08/08/2020  5:17 PM by Homero Fellers, MD     Nursing Staff Provider  Office Location  [facility] Dating    Language  English Anatomy US    Flu Vaccine   Genetic Screen  NIPS:   AFP:   First Screen:    TDaP vaccine    Hgb A1C or  GTT Early : Third trimester :   Rhogam     LAB RESULTS   Feeding Plan  breast Blood Type O NEG  Contraception  Antibody NEG  Circumcision  Rubella    Pediatrician   RPR     Support Person  Mitzi Hansen HBsAg     Prenatal Classes  HIV      Varicella    BTL Consent  GBS  (For PCN allergy, check sensitivities)        VBAC Consent  Pap  2018 NIL     Hgb Electro      CF      SMA   Covid Positive  07/31/2020     Pregnancy Diagnoses Covid in Pregnancy       Previous Version       Preterm labor symptoms and general obstetric precautions including but not limited to vaginal bleeding, contractions, leaking of fluid and fetal movement were reviewed in detail with the patient. Please refer to After Visit Summary for other counseling recommendations.  No tech available for her anatomy scan today. This will be rescheduled. MaternT testing ordered with her NOB labs. Advised  to hydrate more, drink Gatorade and try Miralax and Colace for constipation. Also suggested she try a maternity belt.  Return in about 4 weeks (around 10/18/2020) for return OB.  Imagene Riches, CNM  09/20/2020 4:14 PM

## 2020-09-24 LAB — RPR+RH+ABO+RUB AB+AB SCR+CB...
HIV Screen 4th Generation wRfx: NONREACTIVE
Hematocrit: 29 % — ABNORMAL LOW (ref 34.0–46.6)
Hemoglobin: 9.9 g/dL — ABNORMAL LOW (ref 11.1–15.9)
Hepatitis B Surface Ag: NEGATIVE
MCH: 31.3 pg (ref 26.6–33.0)
MCHC: 34.1 g/dL (ref 31.5–35.7)
MCV: 92 fL (ref 79–97)
Platelets: 197 10*3/uL (ref 150–450)
RBC: 3.16 x10E6/uL — ABNORMAL LOW (ref 3.77–5.28)
RDW: 14.3 % (ref 11.7–15.4)
RPR Ser Ql: NONREACTIVE
Rh Factor: NEGATIVE
Rubella Antibodies, IGG: 3.49 index (ref >0.99)
Varicella zoster IgG: 362 index (ref >165)
WBC: 7.5 10*3/uL (ref 3.4–10.8)

## 2020-09-24 LAB — AB SCR+ANTIBODY ID: Antibody Screen: POSITIVE — AB

## 2020-09-27 LAB — MATERNIT 21 PLUS CORE, BLOOD
Fetal Fraction: 7
Result (T21): NEGATIVE
Trisomy 13 (Patau syndrome): NEGATIVE
Trisomy 18 (Edwards syndrome): NEGATIVE
Trisomy 21 (Down syndrome): NEGATIVE

## 2020-10-02 NOTE — Progress Notes (Signed)
Called patient to discuss her low iron stores. She is not taking a PNV or iron tab. Discussed dietary means of getting more iron and she is advised to start taking PNVs and daily iron. Will need to recheck her CBC at next visit in approximately 4 weeks

## 2020-10-04 ENCOUNTER — Emergency Department: Payer: Medicaid Other

## 2020-10-04 ENCOUNTER — Telehealth: Payer: Self-pay

## 2020-10-04 ENCOUNTER — Emergency Department
Admission: EM | Admit: 2020-10-04 | Discharge: 2020-10-04 | Disposition: A | Discharge status: Home / Self Care | Payer: Medicaid Other | Attending: Emergency Medicine | Admitting: Emergency Medicine

## 2020-10-04 DIAGNOSIS — Z87891 Personal history of nicotine dependence: Secondary | ICD-10-CM | POA: Diagnosis not present

## 2020-10-04 DIAGNOSIS — G9009 Other idiopathic peripheral autonomic neuropathy: Secondary | ICD-10-CM | POA: Diagnosis not present

## 2020-10-04 DIAGNOSIS — R200 Anesthesia of skin: Secondary | ICD-10-CM | POA: Diagnosis not present

## 2020-10-04 DIAGNOSIS — R202 Paresthesia of skin: Secondary | ICD-10-CM

## 2020-10-04 DIAGNOSIS — R55 Syncope and collapse: Secondary | ICD-10-CM | POA: Diagnosis not present

## 2020-10-04 DIAGNOSIS — G629 Polyneuropathy, unspecified: Secondary | ICD-10-CM

## 2020-10-04 DIAGNOSIS — R29818 Other symptoms and signs involving the nervous system: Secondary | ICD-10-CM | POA: Diagnosis not present

## 2020-10-04 DIAGNOSIS — Z8673 Personal history of transient ischemic attack (TIA), and cerebral infarction without residual deficits: Secondary | ICD-10-CM | POA: Diagnosis not present

## 2020-10-04 DIAGNOSIS — R9431 Abnormal electrocardiogram [ECG] [EKG]: Secondary | ICD-10-CM | POA: Diagnosis not present

## 2020-10-04 LAB — DIFFERENTIAL
Abs Immature Granulocytes: 0.08 10*3/uL — ABNORMAL HIGH (ref 0.00–0.07)
Basophils Absolute: 0 10*3/uL (ref 0.0–0.1)
Basophils Relative: 0 %
Eosinophils Absolute: 0.1 10*3/uL (ref 0.0–0.5)
Eosinophils Relative: 1 %
Immature Granulocytes: 1 %
Lymphocytes Relative: 15 %
Lymphs Abs: 1.5 10*3/uL (ref 0.7–4.0)
Monocytes Absolute: 0.7 10*3/uL (ref 0.1–1.0)
Monocytes Relative: 7 %
Neutro Abs: 7.2 10*3/uL (ref 1.7–7.7)
Neutrophils Relative %: 76 %

## 2020-10-04 LAB — CBC
HCT: 29.1 % — ABNORMAL LOW (ref 36.0–46.0)
Hemoglobin: 9.4 g/dL — ABNORMAL LOW (ref 12.0–15.0)
MCH: 30.9 pg (ref 26.0–34.0)
MCHC: 32.3 g/dL (ref 30.0–36.0)
MCV: 95.7 fL (ref 80.0–100.0)
Platelets: 207 10*3/uL (ref 150–400)
RBC: 3.04 MIL/uL — ABNORMAL LOW (ref 3.87–5.11)
RDW: 15.3 % (ref 11.5–15.5)
WBC: 9.7 10*3/uL (ref 4.0–10.5)
nRBC: 0 % (ref 0.0–0.2)

## 2020-10-04 LAB — COMPREHENSIVE METABOLIC PANEL
ALT: 11 U/L (ref 0–44)
AST: 16 U/L (ref 15–41)
Albumin: 3.2 g/dL — ABNORMAL LOW (ref 3.5–5.0)
Alkaline Phosphatase: 49 U/L (ref 38–126)
Anion gap: 10 (ref 5–15)
BUN: 8 mg/dL (ref 6–20)
CO2: 22 mmol/L (ref 22–32)
Calcium: 8.5 mg/dL — ABNORMAL LOW (ref 8.9–10.3)
Chloride: 105 mmol/L (ref 98–111)
Creatinine, Ser: 0.55 mg/dL (ref 0.44–1.00)
GFR, Estimated: >60 mL/min (ref >60)
Glucose, Bld: 101 mg/dL — ABNORMAL HIGH (ref 70–99)
Potassium: 3.5 mmol/L (ref 3.5–5.1)
Sodium: 137 mmol/L (ref 135–145)
Total Bilirubin: 0.4 mg/dL (ref 0.3–1.2)
Total Protein: 6.7 g/dL (ref 6.5–8.1)

## 2020-10-04 LAB — PROTIME-INR
INR: 1 (ref 0.8–1.2)
Prothrombin Time: 12.4 seconds (ref 11.4–15.2)

## 2020-10-04 LAB — CBG MONITORING, ED: Glucose-Capillary: 98 mg/dL (ref 70–99)

## 2020-10-04 LAB — APTT: aPTT: 28 seconds (ref 24–36)

## 2020-10-04 MED ORDER — LORAZEPAM 2 MG/ML IJ SOLN
INTRAMUSCULAR | Status: AC
  Filled 2020-10-04: qty 1

## 2020-10-04 MED ORDER — SODIUM CHLORIDE 0.9% FLUSH
3.0000 mL | Freq: Once | INTRAVENOUS | Status: DC
Start: 2020-10-04 — End: 2020-10-04

## 2020-10-04 MED ORDER — LORAZEPAM 2 MG/ML IJ SOLN
2.0000 mg | Freq: Once | INTRAMUSCULAR | Status: AC
  Administered 2020-10-04: 2 mg via INTRAVENOUS

## 2020-10-04 NOTE — Progress Notes (Signed)
CODE STROKE- PHARMACY COMMUNICATION   Time CODE STROKE called/page received:1347  Time response to CODE STROKE was made (in person or via phone): 1355  Time Stroke Kit retrieved from [place] (only if needed):n/a  Name of Provider/Nurse contacted:   Past Medical History:  Diagnosis Date  . ADHD   . Anxiety   . Depression   . OCD (obsessive compulsive disorder)    Prior to Admission medications   Medication Sig Start Date End Date Taking? Authorizing Provider  acetaminophen-codeine (TYLENOL #3) 300-30 MG tablet Take 1 tablet by mouth every 4 (four) hours as needed for moderate pain. Patient not taking: Reported on 09/20/2020 08/08/20   Homero Fellers, MD  ondansetron (ZOFRAN ODT) 4 MG disintegrating tablet Take 1 tablet (4 mg total) by mouth every 6 (six) hours as needed for nausea. Patient not taking: Reported on 09/20/2020 08/08/20   Homero Fellers, MD    Noralee Space ,PharmD Clinical Pharmacist  10/04/2020  4:10 PM

## 2020-10-04 NOTE — ED Notes (Signed)
Neuro at bedside.  Pt reports at 1200 felt dizzy with numbness starting in left hand radiating to left arm

## 2020-10-04 NOTE — Discharge Instructions (Addendum)
Your MRI was negative for stroke.  This is most likely a peripheral nerve.  To follow-up with neurology for nerve study testing if it becomes consistent.  Return to ER if you develop increasing difficulty with speech, weakness or any other concerns

## 2020-10-04 NOTE — ED Triage Notes (Signed)
Pt to ED via POV, states is currently 20 weeks, states today started having sudden onset L hand pain to L palm, then sudden onset shaking and numbness. Pt states the numbness for the most part resolved, still c/o some tingling/numbness, however states feels like she has a weight to L arm.    No drift, equal strong bilateral grip strengths, facial symmetry intact, no slurred speech. Pt only c/o feeling some numbness to L hand when touched.   Code stroke initiated after discussing patient care with Dr. Corky Downs.

## 2020-10-04 NOTE — Consult Note (Signed)
NEUROLOGY CONSULTATION NOTE   Date of service: October 04, 2020 Patient Name: Erica Ramirez MRN:  [id number] DOB:  1986-06-04 Reason for consult: "Stroke code" _ _ _   _ __   _ __ _ _  __ __   _ __   __ _  History of Present Illness  Erica Ramirez is a 35 y.o. R handed female who is 19+weeks pregnant with PMH significant for ADHD, anxiety, OCD who presents with acute onset L arm numbness and heaviness. Symptoms started at 1200 on 10/04/20 with feeling dizzy, then felt some pressure in the palm of her left hand, then onset of L forearm numbness and left arm heaviness.  No prior hx of stroke, no family hx of stroke, does not smoke, no significant EtOH use. Does report anxiety from just being in the CT scanner. Never had anything like this with her prior anxiety attacks. Does endorse that occasionally, she will get some left arm tingling but no identifiable triggers. She is a stay at home mom. No hx of carpal tunnel syndrome. No hx of tennis elbow. Also endorses some subjective L leg weakness but no objective deficit.  LKW: 1200 on 10/04/20 TPA: no, given her pregnancy and symptoms too mild to treat. Thrombectomy: No, very mild symptoms. MRS: 0 NIHSS: 1 for mild L arm numbness.   ROS   Constitutional Denies weight loss, fever and chills.  HEENT Denies changes in vision and hearing.  Respiratory Denies SOB and cough.  CV Denies palpitations and CP  GI Denies abdominal pain, nausea, vomiting and diarrhea.  GU Denies dysuria and urinary frequency.  MSK Denies myalgia and joint pain.  Skin Denies rash and pruritus.  Neurological Denies headache and syncope.  Psychiatric Denies recent changes in mood. Denies anxiety and depression.   Past History   Past Medical History:  Diagnosis Date  . ADHD   . Anxiety   . Depression   . OCD (obsessive compulsive disorder)    Past Surgical History:  Procedure Laterality Date  . TONSILLECTOMY     Family History  Problem Relation Age of  Onset  . Lung cancer Maternal Grandmother   . Colon cancer Mother   . Ovarian cancer Sister        unsure if actually diagnosed   Social History   Socioeconomic History  . Marital status: Married    Spouse name: Not on file  . Number of children: Not on file  . Years of education: Not on file  . Highest education level: Not on file  Occupational History  . Not on file  Tobacco Use  . Smoking status: Former Smoker    Types: Cigarettes  . Smokeless tobacco: Never Used  Vaping Use  . Vaping Use: Never used  Substance and Sexual Activity  . Alcohol use: Not Currently  . Drug use: Not Currently  . Sexual activity: Yes    Birth control/protection: None  Other Topics Concern  . Not on file  Social History Narrative  . Not on file   Social Determinants of Health   Financial Resource Strain: Not on file  Food Insecurity: Not on file  Transportation Needs: Not on file  Physical Activity: Not on file  Stress: Not on file  Social Connections: Not on file   Allergies  Allergen Reactions  . Ibuprofen Other (See Comments)    Increased bleeding from external hemr.  . Tramadol Itching    Medications  (Not in a hospital admission)    Vitals  Vitals:   10/04/20 1336  BP: (!) 108/52  Pulse: 65  Resp: 17  Temp: 98.1 F (36.7 C)  TempSrc: Oral  SpO2: 99%     There is no height or weight on file to calculate BMI.  Physical Exam   General: Laying comfortably in bed; appears anxious HENT: Normal oropharynx and mucosa. Normal external appearance of ears and nose. Neck: Supple, no pain or tenderness CV: No JVD. No peripheral edema. Pulmonary: Symmetric Chest rise. Normal respiratory effort. Abdomen: distended, non-tender. Ext: No cyanosis, edema, or deformity Skin: No rash. Normal palpation of skin.  Musculoskeletal: Normal digits and nails by inspection. No clubbing.  Neurologic Examination  Mental status/Cognition: Alert, oriented to self, place, month and  year, good attention. Speech/language: Fluent, comprehension intact, object naming intact, repetition intact. Cranial nerves:   CN II Pupils equal and reactive to light, no VF deficits   CN III,IV,VI EOM intact, no gaze preference or deviation, no nystagmus   CN V normal sensation in V1, V2, and V3 segments bilaterally   CN VII no asymmetry, no nasolabial fold flattening   CN VIII normal hearing to speech   CN IX & X normal palatal elevation, no uvular deviation   CN XI 5/5 head turn and 5/5 shoulder shrug bilaterally   CN XII midline tongue protrusion   Motor:  Muscle bulk: normal, tone normal, pronator drift none tremor none Mvmt Root Nerve  Muscle Right Left Comments  SA C5/6 Ax Deltoid 5 5   EF C5/6 Mc Biceps 5 5   EE C6/7/8 Rad Triceps 5 5   WF C6/7 Med FCR 5 5   WE C7/8 PIN ECU 5 5   F Ab C8/T1 U ADM/FDI 5 5   HF L1/2/3 Fem Illopsoas 5 5   KE L2/3/4 Fem Quad 5 5   DF L4/5 D Peron Tib Ant 5 5   PF S1/2 Tibial Grc/Sol 5 5    Reflexes:  Right Left Comments  Pectoralis      Biceps (C5/6)     Brachioradialis (C5/6)      Triceps (C6/7)      Patellar (L3/4)      Achilles (S1)      Hoffman      Plantar     Jaw jerk    Sensation:  Light touch Intact throughout   Pin prick    Temperature    Vibration   Proprioception    Coordination/Complex Motor:  - Finger to Nose intact BL - Heel to shin intact BL - Rapid alternating movement are normal - Gait: Deferred.  Labs   CBC:  Recent Labs  Lab 10/04/20 1354  WBC 9.7  NEUTROABS 7.2  HGB 9.4*  HCT 29.1*  MCV 95.7  PLT 240    Basic Metabolic Panel:  Lab Results  Component Value Date   NA 137 10/04/2020   K 3.5 10/04/2020   CO2 22 10/04/2020   GLUCOSE 101 (H) 10/04/2020   BUN 8 10/04/2020   CREATININE 0.55 10/04/2020   CALCIUM 8.5 (L) 10/04/2020   GFRNONAA >60 10/04/2020   Lipid Panel: No results found for: LDLCALC HgbA1c: No results found for: HGBA1C Urine Drug Screen:     Component Value Date/Time    COCAINSCRNUR Negative 07/11/2020 1645    Alcohol Level No results found for: ETH  CT Head without contrast: CTH was negative for a large hypodensity concerning for a large territory infarct or hyperdensity concerning for an ICH  MRI Brain: no acute  stroke per my read   Impression   Erica Ramirez is a 35 y.o. right handed female  who is 19+weeks pregnant with PMH significant for ADHD, anxiety, OCD who presents with acute onset L arm numbness and heaviness. Symptoms started at 1200 on 10/04/20 with feeling dizzy, then felt some pressure in the palm of her left hand, then onset of L forearm numbness and left arm heaviness. Her neurologic examination is notable for mild decrease in pinprick and light touch in her left posterior forearm.  Endorses that she does get episodes of numbness in the left arm off and on.  Nothing this bad though in the past.  Work-up with CT head without contrast was negative for an acute stroke.  MRI brain without contrast was obtained that demonstrated a new stroke.  Suspect that her current episode is probably peripheral in origin and she would benefit from follow-up with a neurologist outpatient with potential EMG with nerve conduction studies.  My suspicion for this being a stroke is pretty low.  Recommendations  -No further inpatient neurological work-up recommended at this time. -Recommend the patient follow with a neurologist outpatient. ______________________________________________________________________   Thank you for the opportunity to take part in the care of this patient. If you have any further questions, please contact the neurology consultation attending.  Signed,  [place] Pager Number [phone] _ _ _   _ __   _ __ _ _  __ __   _ __   __ _

## 2020-10-04 NOTE — ED Notes (Signed)
See triage note. Pt [redacted] weeks pregnant. States at 12pm started having sudden numbness in left hand radiating to left arm.  Clear speech, alert and oriented. Face symmetrical.

## 2020-10-04 NOTE — Telephone Encounter (Signed)
Patient transferred from front desk. She was advised about 3 days ago the her iron was very low. She had not been taking her prenatal vitamins. She restarted those immediately. About 15 minutes ago she had a sudden onset of numbness/tingling in her left arm and is shaking, feeling light headed/dizzy. She did eat something but that hasn't seemed to help. She feels short of breath. She is able to breath but feels winded. Advised to report to ED for evaluation immediately.

## 2020-10-04 NOTE — ED Notes (Signed)
Pt to MRI

## 2020-10-04 NOTE — ED Provider Notes (Signed)
[facility] Emergency Department Provider Note  ____________________________________________   Event Date/Time   First MD Initiated Contact with Patient 10/04/20 1340     (approximate)  I have reviewed the triage vital signs and the nursing notes.   HISTORY  Chief Complaint Numbness and Near Syncope    HPI Erica Ramirez is a 35 y.o. female with anxiety who comes in for numbness.  Patient reports having some numbness and tingling sensation in her left arm.  Is mostly below the elbow.  There is also some pain associated with it.  She denies any changes in her speech.  Patient reports that the tingling sensation comes and goes but today it was more pronounced than normal so she wanted to be evaluated.  Nothing in particular brings it on, nothing makes it better, associate with some pain.  Patient reports having a history of working in a [employer] where she was doing repetitive movements with that left arm and she had been diagnosed with sciatica and has had some of this left arm tingling sensation since then.  Stroke code was called by another provider.  She denies any chest pain, shortness of breath, urinary symptoms.  She denies any vaginal bleeding and is feeling baby kick      Past Medical History:  Diagnosis Date  . ADHD   . Anxiety   . Depression   . OCD (obsessive compulsive disorder)     Patient Active Problem List   Diagnosis Date Noted  . Encounter for supervision of normal first pregnancy in second trimester 07/11/2020    Past Surgical History:  Procedure Laterality Date  . TONSILLECTOMY      Prior to Admission medications   Medication Sig Start Date End Date Taking? Authorizing Provider  acetaminophen-codeine (TYLENOL #3) 300-30 MG tablet Take 1 tablet by mouth every 4 (four) hours as needed for moderate pain. Patient not taking: Reported on 09/20/2020 08/08/20   Homero Fellers, MD  ondansetron (ZOFRAN ODT) 4 MG  disintegrating tablet Take 1 tablet (4 mg total) by mouth every 6 (six) hours as needed for nausea. Patient not taking: Reported on 09/20/2020 08/08/20   Homero Fellers, MD    Allergies Ibuprofen and Tramadol  Family History  Problem Relation Age of Onset  . Lung cancer Maternal Grandmother   . Colon cancer Mother   . Ovarian cancer Sister        unsure if actually diagnosed    Social History Social History   Tobacco Use  . Smoking status: Former Smoker    Types: Cigarettes  . Smokeless tobacco: Never Used  Vaping Use  . Vaping Use: Never used  Substance Use Topics  . Alcohol use: Not Currently  . Drug use: Not Currently      Review of Systems Constitutional: No fever/chills Eyes: No visual changes. ENT: No sore throat. Cardiovascular: Denies chest pain. Respiratory: Denies shortness of breath. Gastrointestinal: No abdominal pain.  No nausea, no vomiting.  No diarrhea.  No constipation. Genitourinary: Negative for dysuria. Musculoskeletal: Negative for back pain. Skin: Negative for rash. Neurological: Negative for headaches, numbness and tingling in the left arm All other ROS negative ____________________________________________   PHYSICAL EXAM:  VITAL SIGNS: ED Triage Vitals [10/04/20 1336]  Enc Vitals Group     BP (!) 108/52     Pulse Rate 65     Resp 17     Temp 98.1 F (36.7 C)     Temp Source Oral  SpO2 99 %     Weight      Height      Head Circumference      Peak Flow      Pain Score 0     Pain Loc      Pain Edu?      Excl. in [place]?     Constitutional: Alert and oriented. Well appearing and in no acute distress. Eyes: Conjunctivae are normal. EOMI. Head: Atraumatic. Nose: No congestion/rhinnorhea. Mouth/Throat: Mucous membranes are moist.   Neck: No stridor. Trachea Midline. FROM Cardiovascular: Normal rate, regular rhythm. Grossly normal heart sounds.  Good peripheral circulation. Respiratory: Normal respiratory effort.  No  retractions. Lungs CTAB. Gastrointestinal: Soft and nontender.  Gravid uterus no abdominal bruits.  Musculoskeletal: No lower extremity tenderness nor edema.  No joint effusions. Neurologic: Cranial nerves intact.  Reports a little bit of tingling sensation on the back of her left arm from the forearm down but otherwise no weakness noted Skin:  Skin is warm, dry and intact. No rash noted. Psychiatric: Mood and affect are normal. Speech and behavior are normal. GU: Deferred   ____________________________________________   LABS (all labs ordered are listed, but only abnormal results are displayed)  Labs Reviewed  CBC - Abnormal; Notable for the following components:      Result Value   RBC 3.04 (*)    Hemoglobin 9.4 (*)    HCT 29.1 (*)    All other components within normal limits  DIFFERENTIAL - Abnormal; Notable for the following components:   Abs Immature Granulocytes 0.08 (*)    All other components within normal limits  COMPREHENSIVE METABOLIC PANEL - Abnormal; Notable for the following components:   Glucose, Bld 101 (*)    Calcium 8.5 (*)    Albumin 3.2 (*)    All other components within normal limits  PROTIME-INR  APTT  CBG MONITORING, ED   ____________________________________________   ED ECG REPORT I, Vanessa [place], the attending physician, personally viewed and interpreted this ECG.  Normal sinus rate of 71, no ST elevation, no T wave inversions, normal intervals ____________________________________________  RADIOLOGY   Official radiology report(s): MR BRAIN WO CONTRAST  Result Date: 10/04/2020 CLINICAL DATA:  Stroke, follow-up. EXAM: MRI HEAD WITHOUT CONTRAST TECHNIQUE: Multiplanar, multiecho pulse sequences of the brain and surrounding structures were obtained without intravenous contrast. COMPARISON:  None. FINDINGS: Brain: No acute infarction, hemorrhage, hydrocephalus, extra-axial collection or mass lesion. Vascular: Normal flow voids. Skull and upper cervical  spine: Normal marrow signal. Sinuses/Orbits: Negative. Other: None. IMPRESSION: No acute intracranial abnormality identified. Unremarkable MRI of the brain. Electronically Signed   By: Pedro Earls M.D.   On: 10/04/2020 14:55   CT HEAD CODE STROKE WO CONTRAST  Result Date: 10/04/2020 CLINICAL DATA:  Code stroke. Neuro deficit, acute, stroke suspected. Additional history provided: Sudden onset feeling faint, weakness, numbness of left upper extremity. EXAM: CT HEAD WITHOUT CONTRAST TECHNIQUE: Contiguous axial images were obtained from the base of the skull through the vertex without intravenous contrast. COMPARISON:  No pertinent prior exams available for comparison. FINDINGS: Brain: Cerebral volume is normal. There is no acute intracranial hemorrhage. No demarcated cortical infarct. No extra-axial fluid collection. No evidence of intracranial mass. No midline shift. Vascular: No hyperdense vessel. Skull: Normal. Negative for fracture or focal lesion. Sinuses/Orbits: Visualized orbits show no acute finding. Small volume fluid within a posterior left ethmoid air cell. ASPECTS [facility] Stroke Program Early CT Score) - Ganglionic level infarction (caudate, lentiform nuclei,  internal capsule, insula, M1-M3 cortex): 7 - Supraganglionic infarction (M4-M6 cortex): 3 Total score (0-10 with 10 being normal): 10 These results were called by telephone at the time of interpretation on 10/04/2020 at 1:58 pm to provider Lavonia Drafts , who verbally acknowledged these results. IMPRESSION: No evidence of acute intracranial abnormality.  ASPECTS is 10. Mild left ethmoid sinusitis. Electronically Signed   By: Kellie Simmering DO   On: 10/04/2020 13:58    ____________________________________________   PROCEDURES  Procedure(s) performed (including Critical Care):  .1-3 Lead EKG Interpretation Performed by: Vanessa [place], MD Authorized by: Vanessa [place], MD     Interpretation: normal     ECG rate:  60s   ECG  rate assessment: normal     Rhythm: sinus rhythm     Ectopy: none     Conduction: normal       ____________________________________________   INITIAL IMPRESSION / ASSESSMENT AND PLAN / ED COURSE  Joclyn Alsobrook was evaluated in Emergency Department on 10/04/2020 for the symptoms described in the history of present illness. She was evaluated in the context of the global COVID-19 pandemic, which necessitated consideration that the patient might be at risk for infection with the SARS-CoV-2 virus that causes COVID-19. Institutional protocols and algorithms that pertain to the evaluation of patients at risk for COVID-19 are in a state of rapid change based on information released by regulatory bodies including the CDC and federal and state organizations. These policies and algorithms were followed during the patient's care in the ED.     Patient is a 35 year old who comes in with left arm pain and tingling and reported numbness.  Stroke code was called by triage. Dr. Lorrin Goodell is at bedside.  Differential includes hemorrhagic stroke, ischemic stroke, anxiety, peripheral nerve.  Denies any chest pain or shortness of breath to suggest dissection or PE.  She denies any concerns with baby, no vaginal bleeding.  Her abdomen is soft and nontender.  CT head negative patient went for stat MRI that was also negative.  Patient got 2 mg of Ativan.  Hemoglobin was slightly low but patient states that she was told to be started on iron  On reevaluation patient's pain is completely gone.  Her pain and tingling seems to be more peripheral in nature given its on the left arm from the elbow down and is only on the back of the arm.  I discussed with neurology who felt that patient was safe for discharge home to follow-up outpatient with neurology for nerve conduction studies if necessary  Discussed with patient Tylenol.      ____________________________________________   FINAL CLINICAL IMPRESSION(S)  / ED DIAGNOSES   Final diagnoses:  Peripheral nerve dysfunction  Paresthesia      MEDICATIONS GIVEN DURING THIS VISIT:  Medications  sodium chloride flush (NS) 0.9 % injection 3 mL (3 mLs Intravenous Not Given 10/04/20 1346)  LORazepam (ATIVAN) injection 2 mg (2 mg Intravenous Given 10/04/20 1407)     ED Discharge Orders    None       Note:  This document was prepared using Dragon voice recognition software and may include unintentional dictation errors.   Vanessa [place], MD 10/04/20 [phone]

## 2020-10-04 NOTE — ED Notes (Signed)
Called Code stroke to [place] per Merleen Nicely RN  [phone]

## 2020-10-18 ENCOUNTER — Other Ambulatory Visit: Payer: Self-pay

## 2020-10-18 ENCOUNTER — Ambulatory Visit (INDEPENDENT_AMBULATORY_CARE_PROVIDER_SITE_OTHER): Payer: Medicaid Other | Admitting: Obstetrics and Gynecology

## 2020-10-18 VITALS — BP 100/60 | Wt 136.0 lb

## 2020-10-18 DIAGNOSIS — Z3402 Encounter for supervision of normal first pregnancy, second trimester: Secondary | ICD-10-CM

## 2020-10-18 DIAGNOSIS — Z131 Encounter for screening for diabetes mellitus: Secondary | ICD-10-CM

## 2020-10-18 DIAGNOSIS — Z3A23 23 weeks gestation of pregnancy: Secondary | ICD-10-CM

## 2020-10-18 DIAGNOSIS — Z3689 Encounter for other specified antenatal screening: Secondary | ICD-10-CM

## 2020-10-18 DIAGNOSIS — O99012 Anemia complicating pregnancy, second trimester: Secondary | ICD-10-CM

## 2020-10-18 LAB — POCT URINALYSIS DIPSTICK OB
Glucose, UA: NEGATIVE
POC,PROTEIN,UA: NEGATIVE

## 2020-10-18 NOTE — Progress Notes (Signed)
Routine Prenatal Care Visit  Subjective  Erica Ramirez is a 35 y.o. [id number] at [redacted]w[redacted]d being seen today for ongoing prenatal care.  She is currently monitored for the following issues for this low-risk pregnancy and has Encounter for supervision of normal first pregnancy in second trimester on their problem list.  ----------------------------------------------------------------------------------- Patient reports intermittent pain in RLQ. States it feels "sharp" and "pulling". Patient states the pain is causing her to rest in bed regularly for relief. Patient additionally reports intermittent periods of "heavy breathing" which she notes at nighttime and occasionally during the day..   Contractions: Not present. Vag. Bleeding: None.  Movement: Present. Denies leaking of fluid.  ----------------------------------------------------------------------------------- The following portions of the patient's history were reviewed and updated as appropriate: allergies, current medications, past family history, past medical history, past social history, past surgical history and problem list. Problem list updated.   Objective  Blood pressure 100/60, weight 136 lb (61.7 kg), last menstrual period 05/02/2020. Pregravid weight 110 lb (49.9 kg) Total Weight Gain 26 lb (11.8 kg) Urinalysis:      Fetal Status: Fetal Heart Rate (bpm): 135 Fundal Height: 23 cm Movement: Present     General:  Alert, oriented and cooperative. Patient is in no acute distress.  Skin: Skin is warm and dry. No rash noted.   Cardiovascular: Normal heart rate noted  Respiratory: Normal respiratory effort, no problems with respiration noted  Abdomen: Soft, gravid, appropriate for gestational age. Pain/Pressure: Present     Pelvic:  Cervical exam deferred        Extremities: Normal range of motion.  Edema: None  ental Status: Normal mood and affect. Normal behavior. Normal judgment and thought content.     Assessment   35  y.o. [id number] at [redacted]w[redacted]d by  02/09/2021, by Ultrasound presenting for routine prenatal visit  Plan   Sixth Problems (from 07/11/20 to present)    Problem Noted Resolved   Encounter for supervision of normal first pregnancy in second trimester 07/11/2020 by Rod Can, CNM No   Overview Addendum 10/02/2020 12:07 PM by Imagene Riches, CNM     Nursing Staff Provider  Office Location  [facility] Dating    Language  English Anatomy US    Flu Vaccine   Genetic Screen  NIPS:   AFP:   First Screen:  Neg MaternT-xy  TDaP vaccine    Hgb A1C or  GTT Early : Third trimester :   Rhogam     LAB RESULTS   Feeding Plan  breast Blood Type O NEG  Contraception  Antibody NEG  Circumcision  Rubella  immune  Pediatrician   RPR   NR  Support Person  Mitzi Hansen HBsAg   negative  Prenatal Classes  HIV  NR    Varicella  immune  BTL Consent  GBS  (For PCN allergy, check sensitivities)        VBAC Consent  Pap  2018 NIL     Hgb Electro      CF      SMA   Covid Positive  07/31/2020     Pregnancy Diagnoses Covid in Pregnancy       Previous Version      -Reviewed CBC from recent ED visit. Patient reports she has not been regularly taking Fe supplementation and she has not been tolerating dietary changes. Discussed possibility of referral to hematology, patient would prefer this plan of care. Referral placed. -Patient with R sided lower abdominal pain consistent in presentation with round ligament  pain. Recommended abdominal support, warm baths, tylenol, and resting in comfortable positions as tolerated. -Intermittent periods of "heavy breathing," reviewed normal anatomy changes in pregnancy that made lead to this with patient, additionally discussed anemia and role in oxygenation. -Anatomy scan was not rescheduled from previous visit - orders placed today for anatomy scan -Reviewed process for GDM screening at next visit and third trimester labs  Second trimester precautions including but not limited to  vaginal bleeding, contractions, leaking of fluid and fetal movement were reviewed in detail with the patient.    Schedule for Anatomy scan ASAP, and then 4 weeks for ROB with 1h GTT.  Orlie Pollen, CNM, MSN [facility] 10/18/2020, 4:27 PM

## 2020-11-14 ENCOUNTER — Other Ambulatory Visit: Payer: Medicaid Other

## 2020-11-14 ENCOUNTER — Encounter: Payer: Self-pay | Admitting: Oncology

## 2020-11-14 ENCOUNTER — Inpatient Hospital Stay: Payer: Medicaid Other

## 2020-11-14 ENCOUNTER — Encounter: Payer: Self-pay | Admitting: Obstetrics and Gynecology

## 2020-11-14 ENCOUNTER — Other Ambulatory Visit: Payer: Self-pay

## 2020-11-14 ENCOUNTER — Ambulatory Visit (INDEPENDENT_AMBULATORY_CARE_PROVIDER_SITE_OTHER): Payer: Medicaid Other | Admitting: Obstetrics and Gynecology

## 2020-11-14 ENCOUNTER — Inpatient Hospital Stay: Payer: Medicaid Other | Attending: Oncology | Admitting: Oncology

## 2020-11-14 VITALS — BP 118/74 | Wt 145.0 lb

## 2020-11-14 VITALS — BP 105/67 | HR 82 | Temp 98.6°F | Resp 16 | Wt 145.5 lb

## 2020-11-14 DIAGNOSIS — E611 Iron deficiency: Secondary | ICD-10-CM | POA: Insufficient documentation

## 2020-11-14 DIAGNOSIS — O99013 Anemia complicating pregnancy, third trimester: Secondary | ICD-10-CM | POA: Insufficient documentation

## 2020-11-14 DIAGNOSIS — Z87891 Personal history of nicotine dependence: Secondary | ICD-10-CM | POA: Diagnosis not present

## 2020-11-14 DIAGNOSIS — Z809 Family history of malignant neoplasm, unspecified: Secondary | ICD-10-CM | POA: Diagnosis not present

## 2020-11-14 DIAGNOSIS — Z6791 Unspecified blood type, Rh negative: Secondary | ICD-10-CM

## 2020-11-14 DIAGNOSIS — E538 Deficiency of other specified B group vitamins: Secondary | ICD-10-CM | POA: Diagnosis not present

## 2020-11-14 DIAGNOSIS — D649 Anemia, unspecified: Secondary | ICD-10-CM

## 2020-11-14 DIAGNOSIS — Z801 Family history of malignant neoplasm of trachea, bronchus and lung: Secondary | ICD-10-CM | POA: Diagnosis not present

## 2020-11-14 DIAGNOSIS — Z131 Encounter for screening for diabetes mellitus: Secondary | ICD-10-CM | POA: Diagnosis not present

## 2020-11-14 DIAGNOSIS — K625 Hemorrhage of anus and rectum: Secondary | ICD-10-CM | POA: Insufficient documentation

## 2020-11-14 DIAGNOSIS — O26893 Other specified pregnancy related conditions, third trimester: Secondary | ICD-10-CM

## 2020-11-14 DIAGNOSIS — Z800 Family history of malignant neoplasm of digestive organs: Secondary | ICD-10-CM | POA: Diagnosis not present

## 2020-11-14 DIAGNOSIS — Z3A28 28 weeks gestation of pregnancy: Secondary | ICD-10-CM | POA: Insufficient documentation

## 2020-11-14 DIAGNOSIS — Z3A27 27 weeks gestation of pregnancy: Secondary | ICD-10-CM

## 2020-11-14 DIAGNOSIS — Z3403 Encounter for supervision of normal first pregnancy, third trimester: Secondary | ICD-10-CM

## 2020-11-14 LAB — CBC WITH DIFFERENTIAL/PLATELET
Abs Immature Granulocytes: 0.19 10*3/uL — ABNORMAL HIGH (ref 0.00–0.07)
Basophils Absolute: 0 10*3/uL (ref 0.0–0.1)
Basophils Relative: 0 %
Eosinophils Absolute: 0.1 10*3/uL (ref 0.0–0.5)
Eosinophils Relative: 1 %
HCT: 29.7 % — ABNORMAL LOW (ref 36.0–46.0)
Hemoglobin: 9.7 g/dL — ABNORMAL LOW (ref 12.0–15.0)
Immature Granulocytes: 2 %
Lymphocytes Relative: 12 %
Lymphs Abs: 1.4 10*3/uL (ref 0.7–4.0)
MCH: 31.3 pg (ref 26.0–34.0)
MCHC: 32.7 g/dL (ref 30.0–36.0)
MCV: 95.8 fL (ref 80.0–100.0)
Monocytes Absolute: 0.8 10*3/uL (ref 0.1–1.0)
Monocytes Relative: 7 %
Neutro Abs: 9 10*3/uL — ABNORMAL HIGH (ref 1.7–7.7)
Neutrophils Relative %: 78 %
Platelets: 226 10*3/uL (ref 150–400)
RBC: 3.1 MIL/uL — ABNORMAL LOW (ref 3.87–5.11)
RDW: 14 % (ref 11.5–15.5)
WBC: 11.6 10*3/uL — ABNORMAL HIGH (ref 4.0–10.5)
nRBC: 0 % (ref 0.0–0.2)

## 2020-11-14 LAB — PROTIME-INR
INR: 1 (ref 0.8–1.2)
Prothrombin Time: 13.6 seconds (ref 11.4–15.2)

## 2020-11-14 LAB — IRON AND TIBC
Iron: 56 ug/dL (ref 28–170)
Saturation Ratios: 9 % — ABNORMAL LOW (ref 10.4–31.8)
TIBC: 622 ug/dL — ABNORMAL HIGH (ref 250–450)
UIBC: 566 ug/dL

## 2020-11-14 LAB — APTT: aPTT: 27 seconds (ref 24–36)

## 2020-11-14 LAB — FERRITIN: Ferritin: 4 ng/mL — ABNORMAL LOW (ref 11–307)

## 2020-11-14 LAB — FOLATE: Folate: 29 ng/mL (ref >5.9)

## 2020-11-14 LAB — TECHNOLOGIST SMEAR REVIEW
RBC Morphology: NORMAL
WBC Morphology: NORMAL

## 2020-11-14 LAB — VITAMIN B12: Vitamin B-12: 172 pg/mL — ABNORMAL LOW (ref 180–914)

## 2020-11-14 LAB — LACTATE DEHYDROGENASE: LDH: 123 U/L (ref 98–192)

## 2020-11-14 NOTE — Progress Notes (Addendum)
Hematology/Oncology Consult note [facility] [phone] [phone]   Patient Care Team: Patient, No Pcp Per (Inactive) as PCP - General (General Practice)  REFERRING PROVIDER: Orlie Pollen, CNM  CHIEF COMPLAINTS/REASON FOR VISIT:  Evaluation of anemia  HISTORY OF PRESENTING ILLNESS:   Erica Ramirez is a  35 y.o.  female with PMH listed below was seen in consultation at the request of  Orlie Pollen, CNM  for evaluation of anemia  10/04/2020 patient had blood work done which showed hemoglobin 9.4, MCV 95.7 Patient is currently pregnant with her 3rd child, 28 weeks. She reports fatigue, intermittent rectal bleeding, nose bleeding and gum bleeding. She first noticed rectal bleeding in 2014 and was evaluated by a gastroenterologist in [place] and was told she had hemorrhoids. She noticed rectal bleeding is worse recently. She has good appetite, denies unintentional weight loss. Per patient, current pregnancy has no complications.  She is not able to tolerate prenatal vitamins due to severe acid reflux symptoms.   Review of Systems  Constitutional: Negative for appetite change, chills, fatigue and fever.  HENT:   Negative for hearing loss and voice change.   Eyes: Negative for eye problems.  Respiratory: Negative for chest tightness and cough.   Cardiovascular: Negative for chest pain.  Gastrointestinal: Negative for abdominal distention, abdominal pain and blood in stool.  Endocrine: Negative for hot flashes.  Genitourinary: Negative for difficulty urinating and frequency.   Musculoskeletal: Negative for arthralgias.  Skin: Negative for itching and rash.  Neurological: Negative for extremity weakness.  Hematological: Negative for adenopathy.  Psychiatric/Behavioral: Negative for confusion.    MEDICAL HISTORY:  Past Medical History:  Diagnosis Date  . ADHD   . Anxiety   . Depression   . OCD (obsessive compulsive disorder)      SURGICAL HISTORY: Past Surgical History:  Procedure Laterality Date  . TONSILLECTOMY      SOCIAL HISTORY: Social History   Socioeconomic History  . Marital status: Married    Spouse name: Not on file  . Number of children: Not on file  . Years of education: Not on file  . Highest education level: Not on file  Occupational History  . Not on file  Tobacco Use  . Smoking status: Former Smoker    Types: Cigarettes  . Smokeless tobacco: Never Used  Vaping Use  . Vaping Use: Never used  Substance and Sexual Activity  . Alcohol use: Not Currently  . Drug use: Not Currently  . Sexual activity: Yes    Birth control/protection: None  Other Topics Concern  . Not on file  Social History Narrative  . Not on file   Social Determinants of Health   Financial Resource Strain: Not on file  Food Insecurity: Not on file  Transportation Needs: Not on file  Physical Activity: Not on file  Stress: Not on file  Social Connections: Not on file  Intimate Partner Violence: Not on file    FAMILY HISTORY: Family History  Problem Relation Age of Onset  . Lung cancer Maternal Grandmother   . Colon cancer Mother   . Ovarian cancer Sister        unsure if actually diagnosed    ALLERGIES:  is allergic to ibuprofen and tramadol.  MEDICATIONS:  Current Outpatient Medications  Medication Sig Dispense Refill  . ondansetron (ZOFRAN ODT) 4 MG disintegrating tablet Take 1 tablet (4 mg total) by mouth every 6 (six) hours as needed for nausea. 60 tablet 3  . acetaminophen-codeine (TYLENOL #  3) 300-30 MG tablet Take 1 tablet by mouth every 4 (four) hours as needed for moderate pain. (Patient not taking: No sig reported) 10 tablet 0   No current facility-administered medications for this visit.     PHYSICAL EXAMINATION: ECOG PERFORMANCE STATUS: 1 - Symptomatic but completely ambulatory Vitals:   11/14/20 1334  BP: 105/67  Pulse: 82  Resp: 16  Temp: 98.6 F (37 C)  SpO2: 97%    Filed Weights   11/14/20 1334  Weight: 145 lb 8 oz (66 kg)    Physical Exam Constitutional:      General: She is not in acute distress. HENT:     Head: Normocephalic and atraumatic.  Eyes:     General: No scleral icterus. Cardiovascular:     Rate and Rhythm: Normal rate and regular rhythm.     Heart sounds: Normal heart sounds.  Pulmonary:     Effort: Pulmonary effort is normal. No respiratory distress.     Breath sounds: No wheezing.  Abdominal:     General: Bowel sounds are normal.     Comments: Gravid uterus   Musculoskeletal:        General: No deformity. Normal range of motion.     Cervical back: Normal range of motion and neck supple.  Skin:    General: Skin is warm and dry.     Findings: No erythema or rash.  Neurological:     Mental Status: She is alert and oriented to person, place, and time. Mental status is at baseline.     Cranial Nerves: No cranial nerve deficit.     Coordination: Coordination normal.  Psychiatric:        Mood and Affect: Mood normal.     LABORATORY DATA:  I have reviewed the data as listed Lab Results  Component Value Date   WBC 11.6 (H) 11/14/2020   HGB 9.7 (L) 11/14/2020   HCT 29.7 (L) 11/14/2020   MCV 95.8 11/14/2020   PLT 226 11/14/2020   Recent Labs    07/01/20 0903 10/04/20 1354  NA 135 137  K 4.1 3.5  CL 103 105  CO2 23 22  GLUCOSE 92 101*  BUN 15 8  CREATININE 0.58 0.55  CALCIUM 9.2 8.5*  GFRNONAA >60 >60  PROT  --  6.7  ALBUMIN  --  3.2*  AST  --  16  ALT  --  11  ALKPHOS  --  49  BILITOT  --  0.4   Iron/TIBC/Ferritin/ %Sat    Component Value Date/Time   IRON 56 11/14/2020 1430   TIBC 622 (H) 11/14/2020 1430   FERRITIN 4 (L) 11/14/2020 1430   IRONPCTSAT 9 (L) 11/14/2020 1430      RADIOGRAPHIC STUDIES: I have personally reviewed the radiological images as listed and agreed with the findings in the report. No results found.    ASSESSMENT & PLAN:  1. Anemia during pregnancy in third trimester    2. Rectal bleeding   3. B12 deficiency    #Labs are reviewed and discussed with patient I recommend patient to check CVC, iron, TIBC, ferritin, folate, vitamin B12, smear, PT, PTT, vitamin C level LDH  I also discussed with her about the possibility of iron deficiency which is the most common cause for anemia in pregnancy. She is not able to tolerate oral iron. Discussed with her with the option of IV iron infusions Allergy reactions/infusion reaction including anaphylactic reaction discussed with patient. Other side effects include but not limited to  high blood pressure, skin rash, weight gain, leg swelling, etc. patient is made aware that Venofer is categoryB medication. I explained to her the definition of category B.  patient voices understanding and willing to proceed if needed.  Today's lab results are available after patient's visit Iron panel is consistent with severe iron deficiency. Will arrange patient to receive IV venofer weekly x4 B12 deficiency, recommend patient to start oral B12 supplementation 1086mcg daily.   Patient with follow up with me in 8 weeks for evaluation of treatment response.  Orders Placed This Encounter  Procedures  . CBC with Differential/Platelet    Standing Status:   Future    Number of Occurrences:   1    Standing Expiration Date:   11/14/2021  . Technologist smear review    Standing Status:   Future    Number of Occurrences:   1    Standing Expiration Date:   11/14/2021  . Ferritin    Standing Status:   Future    Number of Occurrences:   1    Standing Expiration Date:   05/16/2021  . Iron and TIBC    Standing Status:   Future    Number of Occurrences:   1    Standing Expiration Date:   11/14/2021  . Folate    Standing Status:   Future    Number of Occurrences:   1    Standing Expiration Date:   11/14/2021  . Vitamin B12    Standing Status:   Future    Number of Occurrences:   1    Standing Expiration Date:   11/14/2021  . Lactate  dehydrogenase    Standing Status:   Future    Number of Occurrences:   1    Standing Expiration Date:   11/14/2021  . Vitamin C    Standing Status:   Future    Number of Occurrences:   1    Standing Expiration Date:   11/14/2021  . Protime-INR    Standing Status:   Future    Number of Occurrences:   1    Standing Expiration Date:   11/14/2021  . APTT    Standing Status:   Future    Number of Occurrences:   1    Standing Expiration Date:   11/14/2021  . Ambulatory referral to Gastroenterology    Referral Priority:   Routine    Referral Type:   Consultation    Referral Reason:   Specialty Services Required    Referred to Provider:   Lin Landsman, MD    Number of Visits Requested:   1    All questions were answered. The patient knows to call the clinic with any problems questions or concerns.  cc Orlie Pollen, CNM   Thank you for this kind referral and the opportunity to participate in the care of this patient. A copy of today's note is routed to referring provider    Earlie Server, MD, PhD Hematology Oncology [facility] at [facility] Pager- [phone] 11/14/2020

## 2020-11-14 NOTE — Progress Notes (Signed)
No vb. No lof. 28 week labs today

## 2020-11-14 NOTE — Progress Notes (Signed)
  Routine Prenatal Care Visit  Subjective  Erica Ramirez is a 35 y.o. [id number] at [redacted]w[redacted]d being seen today for ongoing prenatal care.  She is currently monitored for the following issues for this low-risk pregnancy and has Encounter for supervision of normal first pregnancy in second trimester on their problem list.  ----------------------------------------------------------------------------------- Patient reports no complaints.   Contractions: Not present. Vag. Bleeding: None.  Movement: Present. Leaking Fluid denies.  ----------------------------------------------------------------------------------- The following portions of the patient's history were reviewed and updated as appropriate: allergies, current medications, past family history, past medical history, past social history, past surgical history and problem list. Problem list updated.  Objective  Blood pressure 118/74, weight 145 lb (65.8 kg), last menstrual period 05/02/2020. Pregravid weight 110 lb (49.9 kg) Total Weight Gain 35 lb (15.9 kg) Urinalysis: Urine Protein    Urine Glucose    Fetal Status: Fetal Heart Rate (bpm): 125 Fundal Height: 27 cm Movement: Present     General:  Alert, oriented and cooperative. Patient is in no acute distress.  Skin: Skin is warm and dry. No rash noted.   Cardiovascular: Normal heart rate noted  Respiratory: Normal respiratory effort, no problems with respiration noted  Abdomen: Soft, gravid, appropriate for gestational age. Pain/Pressure: Absent     Pelvic:  Cervical exam deferred        Extremities: Normal range of motion.  Edema: None  Mental Status: Normal mood and affect. Normal behavior. Normal judgment and thought content.   Assessment   35 y.o. [id number] at [redacted]w[redacted]d by  02/09/2021, by Ultrasound presenting for routine prenatal visit  Plan   Sixth Problems (from 07/11/20 to present)    Problem Noted Resolved   Encounter for supervision of normal first pregnancy in second  trimester 07/11/2020 by Rod Can, CNM No   Overview Addendum 10/02/2020 12:07 PM by Imagene Riches, CNM     Nursing Staff Provider  Office Location  [facility] Dating    Language  English Anatomy US    Flu Vaccine   Genetic Screen  NIPS:   AFP:   First Screen:  Neg MaternT-xy  TDaP vaccine    Hgb A1C or  GTT Early : Third trimester :   Rhogam     LAB RESULTS   Feeding Plan  breast Blood Type O NEG  Contraception  Antibody NEG  Circumcision  Rubella  immune  Pediatrician   RPR   NR  Support Person  Andrew HBsAg   negative  Prenatal Classes  HIV  NR    Varicella  immune  BTL Consent  GBS  (For PCN allergy, check sensitivities)        VBAC Consent  Pap  2018 NIL     Hgb Electro      CF      SMA   Covid Positive  07/31/2020     Pregnancy Diagnoses Covid in Pregnancy       Previous Version       Preterm labor symptoms and general obstetric precautions including but not limited to vaginal bleeding, contractions, leaking of fluid and fetal movement were reviewed in detail with the patient. Please refer to After Visit Summary for other counseling recommendations.   - 28 week labs/rhogam today - [facility]  Return in about 2 weeks (around 11/28/2020) for Routine Prenatal Appointment.   Prentice Docker, MD, [facility], [facility] 11/14/2020 3:27 PM

## 2020-11-15 LAB — 28 WEEKS RH-PANEL
Antibody Screen: NEGATIVE
Basophils Absolute: 0 10*3/uL (ref 0.0–0.2)
Basos: 0 %
EOS (ABSOLUTE): 0.1 10*3/uL (ref 0.0–0.4)
Eos: 0 %
Gestational Diabetes Screen: 168 mg/dL — ABNORMAL HIGH (ref 65–139)
HIV Screen 4th Generation wRfx: NONREACTIVE
Hematocrit: 28.7 % — ABNORMAL LOW (ref 34.0–46.6)
Hemoglobin: 9.6 g/dL — ABNORMAL LOW (ref 11.1–15.9)
Immature Grans (Abs): 0.1 10*3/uL (ref 0.0–0.1)
Immature Granulocytes: 1 %
Lymphocytes Absolute: 1.3 10*3/uL (ref 0.7–3.1)
Lymphs: 11 %
MCH: 31.3 pg (ref 26.6–33.0)
MCHC: 33.4 g/dL (ref 31.5–35.7)
MCV: 94 fL (ref 79–97)
Monocytes Absolute: 0.7 10*3/uL (ref 0.1–0.9)
Monocytes: 6 %
Neutrophils Absolute: 9.3 10*3/uL — ABNORMAL HIGH (ref 1.4–7.0)
Neutrophils: 82 %
Platelets: 228 10*3/uL (ref 150–450)
RBC: 3.07 x10E6/uL — ABNORMAL LOW (ref 3.77–5.28)
RDW: 13.3 % (ref 11.7–15.4)
RPR Ser Ql: NONREACTIVE
WBC: 11.4 10*3/uL — ABNORMAL HIGH (ref 3.4–10.8)

## 2020-11-18 ENCOUNTER — Encounter: Payer: Self-pay | Admitting: *Deleted

## 2020-11-18 ENCOUNTER — Telehealth: Payer: Self-pay

## 2020-11-18 LAB — VITAMIN C: Vitamin C: 1.1 mg/dL (ref 0.4–2.0)

## 2020-11-18 NOTE — Telephone Encounter (Signed)
-----   Message from Orlie Pollen, [place] sent at 11/18/2020 10:37 AM EDT ----- Regarding: 3h GTT Please call patient and get her scheduled for a 3h GTT. Please add rhogam to reason for next visit as well.   Thanks, Anda Kraft

## 2020-11-18 NOTE — Telephone Encounter (Signed)
Called and left voicemail for patient to call back to be scheduled. 

## 2020-11-18 NOTE — Progress Notes (Signed)
Called to speak with patient via phone. Utilized two patient identifiers. Reviewed third trimester lab values, including results from 1h GTT. Discussed elevated glucose level and recommendation of 3h GTT. Patient stated understanding. Additionally, patient had questions regarding rhogam administration, did not receive at last visit. All questions answered. Message sent to scheduler to get patient schedule for 3h GTT and add rhogam to reason for next ROB.

## 2020-11-18 NOTE — Addendum Note (Signed)
Addended by: Earlie Server on: 11/18/2020 10:30 PM   Modules accepted: Orders

## 2020-11-19 ENCOUNTER — Ambulatory Visit: Payer: Medicaid Other

## 2020-11-19 ENCOUNTER — Telehealth: Payer: Self-pay

## 2020-11-19 DIAGNOSIS — O99013 Anemia complicating pregnancy, third trimester: Secondary | ICD-10-CM

## 2020-11-19 MED ORDER — VITAMIN B-12 1000 MCG PO TABS: 1000.0000 ug | ORAL_TABLET | Freq: Every day | ORAL | 0 refills | Status: AC

## 2020-11-19 NOTE — Telephone Encounter (Signed)
-----   Message from Earlie Server, MD sent at 11/18/2020 10:32 PM EDT ----- Please let her know that her iron level is low, recommend IV venofer weekly x 4. If she agrees, please arrange.  Also Low B12, recommend B12 1085mcg orally.please send her Rx.  Follow up in 8 weeks, lab cbc iron tibc ferritin b12, prior to MD visit + Venofer.

## 2020-11-19 NOTE — Telephone Encounter (Signed)
Done..  Pt has been sched for weekly Venofer x4 1st dose NEW Then she will RTC on 6/17 for labs and on 01/21/21 for  MD/+/- Venofer(DATES PER PT REQUEST)... Almost 8 1/2 weeks ok? Please Advise

## 2020-11-19 NOTE — Telephone Encounter (Signed)
Patient notified and agrees to IV venofer.  Please schedule IV venofer *new* weekly x4. Follow up: labs in 8 weeks ; MD/venofer 1-2 days after labs and notify pt of appts.   Vitamin B12 rx sent to [facility] in [place].

## 2020-11-20 ENCOUNTER — Ambulatory Visit
Admission: RE | Admit: 2020-11-20 | Discharge: 2020-11-20 | Disposition: A | Discharge status: Home / Self Care | Payer: Medicaid Other | Source: Ambulatory Visit | Attending: Obstetrics and Gynecology | Admitting: Obstetrics and Gynecology

## 2020-11-20 ENCOUNTER — Other Ambulatory Visit: Payer: Self-pay

## 2020-11-20 DIAGNOSIS — Z3A28 28 weeks gestation of pregnancy: Secondary | ICD-10-CM | POA: Diagnosis not present

## 2020-11-20 DIAGNOSIS — Z3689 Encounter for other specified antenatal screening: Secondary | ICD-10-CM | POA: Insufficient documentation

## 2020-11-20 NOTE — Telephone Encounter (Signed)
If that is what works for patient. Should be ok.

## 2020-11-21 ENCOUNTER — Ambulatory Visit (INDEPENDENT_AMBULATORY_CARE_PROVIDER_SITE_OTHER): Payer: Medicaid Other

## 2020-11-21 ENCOUNTER — Encounter: Payer: Medicaid Other | Admitting: Obstetrics and Gynecology

## 2020-11-21 ENCOUNTER — Encounter: Payer: Self-pay | Admitting: Obstetrics and Gynecology

## 2020-11-21 ENCOUNTER — Ambulatory Visit (INDEPENDENT_AMBULATORY_CARE_PROVIDER_SITE_OTHER): Payer: Medicaid Other | Admitting: Obstetrics and Gynecology

## 2020-11-21 ENCOUNTER — Other Ambulatory Visit: Payer: Medicaid Other

## 2020-11-21 ENCOUNTER — Other Ambulatory Visit: Payer: Self-pay | Admitting: Obstetrics and Gynecology

## 2020-11-21 DIAGNOSIS — Z3483 Encounter for supervision of other normal pregnancy, third trimester: Secondary | ICD-10-CM

## 2020-11-21 DIAGNOSIS — Z3689 Encounter for other specified antenatal screening: Secondary | ICD-10-CM

## 2020-11-21 DIAGNOSIS — O26892 Other specified pregnancy related conditions, second trimester: Secondary | ICD-10-CM | POA: Diagnosis not present

## 2020-11-21 DIAGNOSIS — Z3A28 28 weeks gestation of pregnancy: Secondary | ICD-10-CM

## 2020-11-21 DIAGNOSIS — Z3402 Encounter for supervision of normal first pregnancy, second trimester: Secondary | ICD-10-CM

## 2020-11-21 DIAGNOSIS — R7309 Other abnormal glucose: Secondary | ICD-10-CM

## 2020-11-21 DIAGNOSIS — O99013 Anemia complicating pregnancy, third trimester: Secondary | ICD-10-CM | POA: Diagnosis not present

## 2020-11-21 DIAGNOSIS — D649 Anemia, unspecified: Secondary | ICD-10-CM

## 2020-11-21 DIAGNOSIS — Z6791 Unspecified blood type, Rh negative: Secondary | ICD-10-CM

## 2020-11-21 MED ORDER — RHO D IMMUNE GLOBULIN 1500 UNIT/2ML IJ SOSY
300.0000 ug | PREFILLED_SYRINGE | Freq: Once | INTRAMUSCULAR | Status: AC
  Administered 2020-11-21: 300 ug via INTRAMUSCULAR

## 2020-11-21 NOTE — Progress Notes (Signed)
Routine Prenatal Care   Virtual Visit via Telephone Note  I connected with Erica Ramirez on 11/21/20 at  1:30 PM EDT by telephone and verified that I am speaking with the correct person using two identifiers.   I discussed the limitations, risks, security and privacy concerns of performing an evaluation and management service by telephone and the availability of in person appointments. I also discussed with the patient that there may be a patient responsible charge related to this service. The patient expressed understanding and agreed to proceed.  The patient was at home I spoke with the patient from my  office The names of people involved in this encounter were: Erica Ramirez and Orlie Pollen, CNM.   Subjective  Erica Ramirez is a 35 y.o. [id number] at [redacted]w[redacted]d being seen today for ongoing prenatal care.  She is currently monitored for the following issues for this low-risk pregnancy and has Encounter for supervision of normal first pregnancy in second trimester and Anemia during pregnancy in third trimester on their problem list.  ----------------------------------------------------------------------------------- Patient reports no complaints.   Contractions: Not present. Vag. Bleeding: None.  Movement: Present. Denies leaking of fluid.  ----------------------------------------------------------------------------------- The following portions of the patient's history were reviewed and updated as appropriate: allergies, current medications, past family history, past medical history, past social history, past surgical history and problem list. Problem list updated.   Objective   Physical Exam could not be performed. This visit was performed over the phone and not in person.   Assessment   35 y.o. [id number] at [redacted]w[redacted]d by  02/09/2021, by Ultrasound presenting for routine prenatal visit  Plan   Sixth Problems (from 07/11/20 to present)    Problem Noted Resolved    Encounter for supervision of normal first pregnancy in second trimester 07/11/2020 by Rod Can, CNM No   Overview Addendum 11/21/2020  2:25 PM by Orlie Pollen, CNM     Nursing Staff Provider  Office Location  [facility] Dating   9wk [place]  Language  English Anatomy US   normal, complete (meaursing ahead)  Flu Vaccine   Genetic Screen  NIPS: Neg MaternT-xy  TDaP vaccine    Hgb A1C or  GTT Early : Third trimester :   Rhogam   11/21/20   LAB RESULTS   Feeding Plan  breast Blood Type O NEG  Contraception  Antibody NEG  Circumcision  Rubella  immune  Pediatrician   RPR   NR  Support Person  Andrew HBsAg   negative  Prenatal Classes  HIV  NR    Varicella  immune  BTL Consent  GBS  (For PCN allergy, check sensitivities)        VBAC Consent  Pap  2018 NIL     Hgb Electro      CF      SMA   Covid Positive  07/31/2020     Pregnancy Diagnoses Covid in Pregnancy       Previous Version      -Patient was unable to complete 3h GTT today, her plan is to return tomorrow to complete the test -Rhogam was given when patient was present in the office earlier today -Reviewed anatomy scan findings with patient - plan made for growth [place] f/u in 4-6 weeks  Preterm labor obstetric precautions including but not limited to vaginal bleeding, contractions, leaking of fluid and fetal movement were reviewed in detail with the patient.    Return in about 2 weeks (around 12/05/2020) for ROB - (may prefer  Friday visit) - order placed for growth [place] at [facility] in 4-6 weeks.   I discussed the assessment and treatment plan with the patient. The patient was provided an opportunity to ask questions and all were answered. The patient agreed with the plan and demonstrated an understanding of the instructions.   The patient was advised to call back or seek an in-person evaluation if the symptoms worsen or if the condition fails to improve as anticipated.  I provided 13 minutes of non-face-to-face time during this  encounter.  Orlie Pollen, CNM, MSN [facility], [facility] 11/21/2020, 2:36 PM

## 2020-11-22 ENCOUNTER — Other Ambulatory Visit: Payer: Medicaid Other

## 2020-11-22 ENCOUNTER — Other Ambulatory Visit: Payer: Self-pay | Admitting: Obstetrics and Gynecology

## 2020-11-22 ENCOUNTER — Other Ambulatory Visit: Payer: Self-pay

## 2020-11-22 DIAGNOSIS — R7309 Other abnormal glucose: Secondary | ICD-10-CM | POA: Diagnosis not present

## 2020-11-23 LAB — GESTATIONAL GLUCOSE TOLERANCE
Glucose, Fasting: 81 mg/dL (ref 65–94)
Glucose, GTT - 1 Hour: 190 mg/dL — ABNORMAL HIGH (ref 65–179)
Glucose, GTT - 2 Hour: 121 mg/dL (ref 65–154)
Glucose, GTT - 3 Hour: 76 mg/dL (ref 65–139)

## 2020-11-29 ENCOUNTER — Inpatient Hospital Stay: Payer: Medicaid Other

## 2020-11-29 ENCOUNTER — Other Ambulatory Visit: Payer: Self-pay

## 2020-11-29 VITALS — BP 97/60 | HR 73 | Temp 98.1°F | Resp 16

## 2020-11-29 DIAGNOSIS — E611 Iron deficiency: Secondary | ICD-10-CM | POA: Diagnosis not present

## 2020-11-29 DIAGNOSIS — O99013 Anemia complicating pregnancy, third trimester: Secondary | ICD-10-CM

## 2020-11-29 DIAGNOSIS — Z3A28 28 weeks gestation of pregnancy: Secondary | ICD-10-CM | POA: Diagnosis not present

## 2020-11-29 DIAGNOSIS — K625 Hemorrhage of anus and rectum: Secondary | ICD-10-CM | POA: Diagnosis not present

## 2020-11-29 MED ORDER — SODIUM CHLORIDE 0.9 % IV SOLN: 200.0000 mg | Freq: Once | INTRAVENOUS | Status: DC

## 2020-11-29 MED ORDER — SODIUM CHLORIDE 0.9 % IV SOLN
Freq: Once | INTRAVENOUS | Status: AC
  Filled 2020-11-29: qty 250

## 2020-11-29 MED ORDER — IRON SUCROSE 20 MG/ML IV SOLN
200.0000 mg | Freq: Once | INTRAVENOUS | Status: AC
Start: 2020-11-29 — End: 2020-11-29
  Administered 2020-11-29: 200 mg via INTRAVENOUS
  Filled 2020-11-29: qty 10

## 2020-11-29 NOTE — Progress Notes (Signed)
Patient monitored x 30 minutes post first Venofer infusion. Patient tolerated well. Denies any questions or concerns. Discharged home.

## 2020-12-05 ENCOUNTER — Ambulatory Visit (INDEPENDENT_AMBULATORY_CARE_PROVIDER_SITE_OTHER): Payer: Medicaid Other | Admitting: Advanced Practice Midwife

## 2020-12-05 ENCOUNTER — Other Ambulatory Visit: Payer: Self-pay

## 2020-12-05 ENCOUNTER — Encounter: Payer: Self-pay | Admitting: Advanced Practice Midwife

## 2020-12-05 DIAGNOSIS — Z3A30 30 weeks gestation of pregnancy: Secondary | ICD-10-CM | POA: Diagnosis not present

## 2020-12-05 DIAGNOSIS — Z3483 Encounter for supervision of other normal pregnancy, third trimester: Secondary | ICD-10-CM

## 2020-12-05 NOTE — Progress Notes (Signed)
Routine Prenatal Care Visit- Virtual Visit  Subjective   Virtual Visit via Telephone Note due to patient having a sore throat  I connected with Erica Ramirez on 12/05/20 at  3:30 PM EDT by telephone and verified that I am speaking with the correct person using two identifiers.   I discussed the limitations, risks, security and privacy concerns of performing an evaluation and management service by telephone and the availability of in person appointments. I also discussed with the patient that there may be a patient responsible charge related to this service. The patient expressed understanding and agreed to proceed.  The patient was in her car I spoke with the patient from my  Office phone The names of people involved in this encounter were: Erica Ramirez and myself Rod Can, CNM  Erica Ramirez is a 35 y.o. [id number] at [redacted]w[redacted]d being seen today for ongoing prenatal care.  She is currently monitored for the following issues for this low-risk pregnancy and has Supervision of other normal pregnancy, antepartum and Anemia during pregnancy in third trimester on their problem list.  ----------------------------------------------------------------------------------- Patient reports no complaints.   Contractions: Not present. Vag. Bleeding: None.  Movement: Present. Denies leaking of fluid.  ----------------------------------------------------------------------------------- The following portions of the patient's history were reviewed and updated as appropriate: allergies, current medications, past family history, past medical history, past social history, past surgical history and problem list. Problem list updated.   Objective  Last menstrual period 05/02/2020. Pregravid weight 110 lb (49.9 kg) Total Weight Gain 35 lb (15.9 kg) Urinalysis:      Fetal Status:     Movement: Present     Physical Exam could not be performed. Because of the COVID-19 outbreak this visit  was performed over the phone and not in person.   Assessment   35 y.o. [id number] at [redacted]w[redacted]d by  02/09/2021, by Ultrasound presenting for routine prenatal visit  Plan   Sixth Problems (from 07/11/20 to present)    Problem Noted Resolved   Supervision of other normal pregnancy, antepartum 07/11/2020 by Rod Can, CNM No   Overview Addendum 11/21/2020  2:37 PM by Orlie Pollen, CNM     Nursing Staff Provider  Office Location  [facility] Dating     Language  English Anatomy US    Flu Vaccine   Genetic Screen  NIPS:   AFP:   First Screen:  Neg MaternT-xy  TDaP vaccine    Hgb A1C or  GTT Early : Third trimester :   Rhogam   11/21/20   LAB RESULTS   Feeding Plan  breast Blood Type O NEG  Contraception  Antibody NEG  Circumcision  Rubella  immune  Pediatrician   RPR   NR  Support Person  Andrew HBsAg   negative  Prenatal Classes  HIV  NR    Varicella  immune  BTL Consent  GBS  (For PCN allergy, check sensitivities)        VBAC Consent  Pap  2018 NIL     Hgb Electro      CF      SMA   Covid Positive  07/31/2020     Pregnancy Diagnoses Covid in Pregnancy       Previous Version       Gestational age appropriate obstetric precautions including but not limited to vaginal bleeding, contractions, leaking of fluid and fetal movement were reviewed in detail with the patient.     Follow Up Instructions:   I discussed the assessment and  treatment plan with the patient. The patient was provided an opportunity to ask questions and all were answered. The patient agreed with the plan and demonstrated an understanding of the instructions.   The patient was advised to call back or seek an in-person evaluation if the symptoms worsen or if the condition fails to improve as anticipated.  I provided 10 minutes of non-face-to-face time during this encounter.  Return in about 2 weeks (around 12/19/2020) for rob.   Christean Leaf, CNM [facility] 12/05/20,  3:48 PM

## 2020-12-06 ENCOUNTER — Inpatient Hospital Stay: Payer: Medicaid Other | Attending: Oncology

## 2020-12-06 DIAGNOSIS — O99013 Anemia complicating pregnancy, third trimester: Secondary | ICD-10-CM | POA: Insufficient documentation

## 2020-12-06 DIAGNOSIS — E611 Iron deficiency: Secondary | ICD-10-CM | POA: Insufficient documentation

## 2020-12-06 DIAGNOSIS — Z3A28 28 weeks gestation of pregnancy: Secondary | ICD-10-CM | POA: Insufficient documentation

## 2020-12-12 ENCOUNTER — Encounter: Payer: Self-pay | Admitting: Emergency Medicine

## 2020-12-12 ENCOUNTER — Other Ambulatory Visit: Payer: Self-pay

## 2020-12-12 ENCOUNTER — Emergency Department
Admission: EM | Admit: 2020-12-12 | Discharge: 2020-12-12 | Disposition: A | Discharge status: Home / Self Care | Payer: Medicaid Other | Attending: Student in an Organized Health Care Education/Training Program | Admitting: Student in an Organized Health Care Education/Training Program

## 2020-12-12 DIAGNOSIS — Z28310 Unvaccinated for covid-19: Secondary | ICD-10-CM | POA: Insufficient documentation

## 2020-12-12 DIAGNOSIS — J069 Acute upper respiratory infection, unspecified: Secondary | ICD-10-CM | POA: Insufficient documentation

## 2020-12-12 DIAGNOSIS — B9789 Other viral agents as the cause of diseases classified elsewhere: Secondary | ICD-10-CM | POA: Diagnosis not present

## 2020-12-12 DIAGNOSIS — Z3A32 32 weeks gestation of pregnancy: Secondary | ICD-10-CM | POA: Diagnosis not present

## 2020-12-12 DIAGNOSIS — Z20822 Contact with and (suspected) exposure to covid-19: Secondary | ICD-10-CM | POA: Insufficient documentation

## 2020-12-12 DIAGNOSIS — O99513 Diseases of the respiratory system complicating pregnancy, third trimester: Secondary | ICD-10-CM | POA: Diagnosis not present

## 2020-12-12 DIAGNOSIS — Z87891 Personal history of nicotine dependence: Secondary | ICD-10-CM | POA: Diagnosis not present

## 2020-12-12 DIAGNOSIS — R059 Cough, unspecified: Secondary | ICD-10-CM | POA: Diagnosis present

## 2020-12-12 LAB — URINALYSIS, COMPLETE (UACMP) WITH MICROSCOPIC
Bacteria, UA: NONE SEEN
Bilirubin Urine: NEGATIVE
Glucose, UA: NEGATIVE mg/dL
Hgb urine dipstick: NEGATIVE
Ketones, ur: NEGATIVE mg/dL
Leukocytes,Ua: NEGATIVE
Nitrite: NEGATIVE
Protein, ur: 30 mg/dL — AB
Specific Gravity, Urine: 1.026 (ref 1.005–1.030)
pH: 6 (ref 5.0–8.0)

## 2020-12-12 LAB — RESP PANEL BY RT-PCR (FLU A&B, COVID) ARPGX2
Influenza A by PCR: NEGATIVE
Influenza B by PCR: NEGATIVE
SARS Coronavirus 2 by RT PCR: NEGATIVE

## 2020-12-12 NOTE — ED Provider Notes (Signed)
[facility] Emergency Department Provider Note  ____________________________________________   Event Date/Time   First MD Initiated Contact with Patient 12/12/20 [phone]     (approximate)  I have reviewed the triage vital signs and the nursing notes.   HISTORY  Chief Complaint Cough   HPI Erica Ramirez is a 35 y.o. female presents to the ED with complaint of nasal congestion, cough, headache and loss of taste for approximately 1 week.  Patient denies any difficulty breathing.  She states that her son who is 3 has been sick with similar symptoms for more than a week.  Patient currently is [redacted] weeks pregnant.  She is not vaccinated and wants COVID testing.  Rates her pain as 5 out of 10.       Past Medical History:  Diagnosis Date  . ADHD   . Anxiety   . Depression   . OCD (obsessive compulsive disorder)     Patient Active Problem List   Diagnosis Date Noted  . Anemia during pregnancy in third trimester 11/14/2020  . Supervision of other normal pregnancy, antepartum 07/11/2020    Past Surgical History:  Procedure Laterality Date  . TONSILLECTOMY      Prior to Admission medications   Medication Sig Start Date End Date Taking? Authorizing Provider  ondansetron (ZOFRAN ODT) 4 MG disintegrating tablet Take 1 tablet (4 mg total) by mouth every 6 (six) hours as needed for nausea. 08/08/20   Schuman, Stefanie Libel, MD  vitamin B-12 (CYANOCOBALAMIN) 1000 MCG tablet Take 1 tablet (1,000 mcg total) by mouth daily. 11/19/20   Earlie Server, MD    Allergies Ibuprofen and Tramadol  Family History  Problem Relation Age of Onset  . Lung cancer Maternal Grandmother   . Colon cancer Mother   . Ovarian cancer Sister        unsure if actually diagnosed    Social History Social History   Tobacco Use  . Smoking status: Former Smoker    Types: Cigarettes  . Smokeless tobacco: Never Used  Vaping Use  . Vaping Use: Never used  Substance Use Topics  .  Alcohol use: Not Currently  . Drug use: Not Currently    Review of Systems Constitutional: No fever/chills Eyes: No visual changes. ENT: No sore throat.  Positive for loss of taste. Cardiovascular: Denies chest pain.  Respiratory: Denies shortness of breath.  Positive for cough. Gastrointestinal:  No nausea, no vomiting.  No diarrhea.  Genitourinary: Negative for dysuria. Musculoskeletal: Negative for musculoskeletal pain. Skin: Negative for rash. Neurological: Positive for headaches, negative for focal weakness or numbness. ____________________________________________   PHYSICAL EXAM:  VITAL SIGNS: ED Triage Vitals [12/12/20 0814]  Enc Vitals Group     BP      Pulse      Resp      Temp      Temp src      SpO2      Weight 148 lb (67.1 kg)     Height 5\' 7"  (1.702 m)     Head Circumference      [facility] Flow      Pain Score 5     Pain Loc      Pain Edu?      Excl. in [place]?     Constitutional: Alert and oriented. Well appearing and in no acute distress. Eyes: Conjunctivae are normal.  Head: Atraumatic. Nose: No congestion/rhinnorhea. Mouth/Throat: Mucous membranes are moist.  Oropharynx non-erythematous. Neck: No stridor.   Cardiovascular: Normal rate,  regular rhythm. Grossly normal heart sounds.  Good peripheral circulation. Respiratory: Normal respiratory effort.  No retractions. Lungs CTAB. Gastrointestinal: Soft and nontender. No distention. No CVA tenderness. Musculoskeletal: Moves upper and lower extremities without any difficulty.  Normal gait was noted. Neurologic:  Normal speech and language. No gross focal neurologic deficits are appreciated. No gait instability. Skin:  Skin is warm, dry and intact. No rash noted. Psychiatric: Mood and affect are normal. Speech and behavior are normal.  ____________________________________________   LABS (all labs ordered are listed, but only abnormal results are displayed)  Labs Reviewed  URINALYSIS, COMPLETE (UACMP) WITH  MICROSCOPIC - Abnormal; Notable for the following components:      Result Value   Color, Urine YELLOW (*)    APPearance HAZY (*)    Protein, ur 30 (*)    All other components within normal limits  RESP PANEL BY RT-PCR (FLU A&B, COVID) [id number]     PROCEDURES  Procedure(s) performed (including Critical Care):  Procedures   ____________________________________________   INITIAL IMPRESSION / ASSESSMENT AND PLAN / ED COURSE  As part of my medical decision making, I reviewed the following data within the electronic MEDICAL RECORD NUMBER Notes from prior ED visits and [place] Controlled Substance Database  35 year old female presents to the ED with concerns of possible COVID.  Patient is requesting testing due to cough, loss of taste and headaches.  Patient currently is [redacted] weeks pregnant.  She also has had her child here twice with some symptoms but has tested negative for COVID and influenza both times.  Physical exam is unremarkable.  Patient is negative for influenza and COVID.  Urinalysis was reassuring.  Patient is aware that she can only treat the symptoms with medication that has been approved by her OB/GYN.  She will also increase fluids.  Patient is to follow-up with her PCP or OB/GYN if any continued problems.  ____________________________________________   FINAL CLINICAL IMPRESSION(S) / ED DIAGNOSES  Final diagnoses:  Viral URI with cough     ED Discharge Orders    None      *Please note:  [name] was evaluated in Emergency Department on 12/12/2020 for the symptoms described in the history of present illness. She was evaluated in the context of the global COVID-19 pandemic, which necessitated consideration that the patient might be at risk for infection with the SARS-CoV-2 virus that causes COVID-19. Institutional protocols and algorithms that pertain to the evaluation of patients at risk for COVID-19 are in a state of rapid change based on information released by  regulatory bodies including the CDC and federal and state organizations. These policies and algorithms were followed during the patient's care in the ED.  Some ED evaluations and interventions may be delayed as a result of limited staffing during and the pandemic.*   Note:  This document was prepared using Dragon voice recognition software and may include unintentional dictation errors.    Johnn Hai, PA-C 12/12/20 1607    Merlyn Lot, MD 12/13/20 [phone]

## 2020-12-12 NOTE — Discharge Instructions (Addendum)
Follow-up with your primary care provider or OB/GYN if any continued problems.  Increase fluids.  You may take Tylenol sparingly.  You may also use saline nose spray as needed for nasal congestion.  With your pregnancy your limited medications.  Always call your OB/GYN before taking any over-the-counter medications.

## 2020-12-12 NOTE — ED Triage Notes (Signed)
Pt comes into the ED via POV c/o possible COVID and wanting to be tested due to cough, loss of taste, and headaches.  Pt is currently [redacted] weeks pregnant.  Pt has even and unlabored respirations at this time and is ambulatory to exam room.

## 2020-12-13 ENCOUNTER — Inpatient Hospital Stay: Payer: Medicaid Other

## 2020-12-13 VITALS — BP 106/57 | HR 74 | Temp 97.2°F | Resp 18

## 2020-12-13 DIAGNOSIS — E611 Iron deficiency: Secondary | ICD-10-CM | POA: Diagnosis not present

## 2020-12-13 DIAGNOSIS — O99013 Anemia complicating pregnancy, third trimester: Secondary | ICD-10-CM

## 2020-12-13 DIAGNOSIS — Z3A28 28 weeks gestation of pregnancy: Secondary | ICD-10-CM | POA: Diagnosis not present

## 2020-12-13 MED ORDER — SODIUM CHLORIDE 0.9 % IV SOLN
Freq: Once | INTRAVENOUS | Status: AC
  Filled 2020-12-13: qty 250

## 2020-12-13 MED ORDER — SODIUM CHLORIDE 0.9 % IV SOLN: 200.0000 mg | Freq: Once | INTRAVENOUS | Status: DC

## 2020-12-13 MED ORDER — IRON SUCROSE 20 MG/ML IV SOLN
200.0000 mg | Freq: Once | INTRAVENOUS | Status: AC
  Administered 2020-12-13: 200 mg via INTRAVENOUS
  Filled 2020-12-13: qty 10

## 2020-12-13 NOTE — Patient Instructions (Signed)
[facility] [place] [facility]    Discharge Instructions:  Thank you for choosing [facility] to provide your oncology and hematology care.  If you have a lab appointment with the Cancer Center, please go directly to the Cancer Center and check in at the registration area.  We strive to give you quality time with your provider. You may need to reschedule your appointment if you arrive late (15 or more minutes).  Arriving late affects you and other patients whose appointments are after yours.  Also, if you miss three or more appointments without notifying the office, you may be dismissed from the clinic at the provider's discretion.      For prescription refill requests, have your pharmacy contact our office and allow 72 hours for refills to be completed.    Today you received the following treatment: Venofer.  BELOW ARE SYMPTOMS THAT SHOULD BE REPORTED IMMEDIATELY: . *FEVER GREATER THAN 100.4 F (38 C) OR HIGHER . *CHILLS OR SWEATING . *NAUSEA AND VOMITING THAT IS NOT CONTROLLED WITH YOUR NAUSEA MEDICATION . *UNUSUAL SHORTNESS OF BREATH . *UNUSUAL BRUISING OR BLEEDING . *URINARY PROBLEMS (pain or burning when urinating, or frequent urination) . *BOWEL PROBLEMS (unusual diarrhea, constipation, pain near the anus) . TENDERNESS IN MOUTH AND THROAT WITH OR WITHOUT PRESENCE OF ULCERS (sore throat, sores in mouth, or a toothache) . UNUSUAL RASH, SWELLING OR PAIN  . UNUSUAL VAGINAL DISCHARGE OR ITCHING   Items with * indicate a potential emergency and should be followed up as soon as possible or go to the Emergency Department if any problems should occur.  Should you have questions after your visit or need to cancel or reschedule your appointment, please contact [facility] [place] [facility]  [phone] and follow the prompts.  Office hours are 8:00 a.m. to 4:30 p.m. Monday - Friday. Please note that voicemails left after 4:00 p.m. may not be  returned until the following business day.  We are closed weekends and major holidays. You have access to a nurse at all times for urgent questions. Please call the main number to the clinic [phone] and follow the prompts.  For any non-urgent questions, you may also contact your provider using MyChart. We now offer e-Visits for anyone 18 and older to request care online for non-urgent symptoms. For details visit [email or web address].   Also download the MyChart app! Go to the app store, search "MyChart", open the app, select [facility], and log in with your MyChart username and password.  Due to Covid, a mask is required upon entering the hospital/clinic. If you do not have a mask, one will be given to you upon arrival. For doctor visits, patients may have 1 support person aged 18 or older with them. For treatment visits, patients cannot have anyone with them due to current Covid guidelines and our immunocompromised population.   Iron Sucrose injection  What is this medicine? IRON SUCROSE (AHY ern SOO krohs) is an iron complex. Iron is used to make healthy red blood cells, which carry oxygen and nutrients throughout the body. This medicine is used to treat iron deficiency anemia in people with chronic kidney disease. This medicine may be used for other purposes; ask your health care provider or pharmacist if you have questions. COMMON BRAND NAME(S): Venofer What should I tell my health care provider before I take this medicine? They need to know if you have any of these conditions:  anemia not caused by low iron levels    heart disease  high levels of iron in the blood  kidney disease  liver disease  an unusual or allergic reaction to iron, other medicines, foods, dyes, or preservatives  pregnant or trying to get pregnant  breast-feeding How should I use this medicine? This medicine is for infusion into a vein. It is given by a health care professional in a hospital or clinic  setting. Talk to your pediatrician regarding the use of this medicine in children. While this drug may be prescribed for children as young as 2 years for selected conditions, precautions do apply. Overdosage: If you think you have taken too much of this medicine contact a poison control center or emergency room at once. NOTE: This medicine is only for you. Do not share this medicine with others. What if I miss a dose? It is important not to miss your dose. Call your doctor or health care professional if you are unable to keep an appointment. What may interact with this medicine? Do not take this medicine with any of the following medications:  deferoxamine  dimercaprol  other iron products This medicine may also interact with the following medications:  chloramphenicol  deferasirox This list may not describe all possible interactions. Give your health care provider a list of all the medicines, herbs, non-prescription drugs, or dietary supplements you use. Also tell them if you smoke, drink alcohol, or use illegal drugs. Some items may interact with your medicine. What should I watch for while using this medicine? Visit your doctor or healthcare professional regularly. Tell your doctor or healthcare professional if your symptoms do not start to get better or if they get worse. You may need blood work done while you are taking this medicine. You may need to follow a special diet. Talk to your doctor. Foods that contain iron include: whole grains/cereals, dried fruits, beans, or peas, leafy green vegetables, and organ meats (liver, kidney). What side effects may I notice from receiving this medicine? Side effects that you should report to your doctor or health care professional as soon as possible:  allergic reactions like skin rash, itching or hives, swelling of the face, lips, or tongue  breathing problems  changes in blood pressure  cough  fast, irregular heartbeat  feeling faint  or lightheaded, falls  fever or chills  flushing, sweating, or hot feelings  joint or muscle aches/pains  seizures  swelling of the ankles or feet  unusually weak or tired Side effects that usually do not require medical attention (report to your doctor or health care professional if they continue or are bothersome):  diarrhea  feeling achy  headache  irritation at site where injected  nausea, vomiting  stomach upset  tiredness This list may not describe all possible side effects. Call your doctor for medical advice about side effects. You may report side effects to FDA at 1-800-FDA-1088. Where should I keep my medicine? This drug is given in a hospital or clinic and will not be stored at home. NOTE: This sheet is a summary. It may not cover all possible information. If you have questions about this medicine, talk to your doctor, pharmacist, or health care provider.  2021 Elsevier/Gold Standard (2011-04-30 17:14:35)  

## 2020-12-19 ENCOUNTER — Ambulatory Visit (INDEPENDENT_AMBULATORY_CARE_PROVIDER_SITE_OTHER): Payer: Medicaid Other | Admitting: Obstetrics & Gynecology

## 2020-12-19 ENCOUNTER — Other Ambulatory Visit: Payer: Self-pay

## 2020-12-19 ENCOUNTER — Encounter: Payer: Self-pay | Admitting: Obstetrics & Gynecology

## 2020-12-19 VITALS — BP 120/80 | Wt 149.0 lb

## 2020-12-19 DIAGNOSIS — Z3480 Encounter for supervision of other normal pregnancy, unspecified trimester: Secondary | ICD-10-CM

## 2020-12-19 DIAGNOSIS — Z3483 Encounter for supervision of other normal pregnancy, third trimester: Secondary | ICD-10-CM

## 2020-12-19 DIAGNOSIS — Z23 Encounter for immunization: Secondary | ICD-10-CM

## 2020-12-19 DIAGNOSIS — Z3A32 32 weeks gestation of pregnancy: Secondary | ICD-10-CM

## 2020-12-19 LAB — POCT URINALYSIS DIPSTICK OB
Glucose, UA: NEGATIVE
POC,PROTEIN,UA: NEGATIVE

## 2020-12-19 NOTE — Patient Instructions (Signed)

## 2020-12-19 NOTE — Progress Notes (Signed)
  Subjective  Fetal Movement? yes Contractions? no Leaking Fluid? no Vaginal Bleeding? no  Objective  BP 120/80   Wt 149 lb (67.6 kg)   LMP 05/02/2020 (Within Weeks)   BMI 23.34 kg/m  General: NAD Pumonary: no increased work of breathing Abdomen: gravid, non-tender Extremities: no edema Psychiatric: mood appropriate, affect full  Assessment  35 y.o. [id number] at [redacted]w[redacted]d by  02/09/2021, by Ultrasound presenting for routine prenatal visit  Plan   Problem List Items Addressed This Visit      Other   Supervision of other normal pregnancy, antepartum    Other Visit Diagnoses    [redacted] weeks gestation of pregnancy    -  Primary   Encounter for supervision of other normal pregnancy in third trimester        PNV, [place] Discussed birth control options.  She prefers "pull out" method despite its low success rates. TDaP today Plans to breast feed.  Sixth Problems (from 07/11/20 to present)    Problem Noted Resolved   Supervision of other normal pregnancy, antepartum 07/11/2020 by Rod Can, CNM No   Overview Addendum 12/19/2020 11:41 AM by Gae Dry, MD     Nursing Staff Provider  Office Location  [facility] Dating   [place]  Language  English Anatomy US   [facility]  Flu Vaccine  decl Genetic Screen  NIPS:   AFP:   First Screen:  Neg MaternT-xy  TDaP vaccine   12/19/20 Hgb A1C or  GTT Third trimester :168, 3 hour nml   Rhogam   11/21/20   LAB RESULTS   Feeding Plan  breast Blood Type O NEG  Contraception  None desired Antibody NEG  Circumcision  Rubella  immune  Pediatrician   RPR   NR  Support Person  Andrew HBsAg   negative  Prenatal Classes  HIV  NR    Varicella  immune  BTL Consent  GBS  (For PCN allergy, check sensitivities)        VBAC Consent n/a Pap  2018 NIL     Hgb Electro      CF      SMA   Covid Positive  07/31/2020           Barnett Applebaum, MD, [facility], [facility] 12/19/2020  11:42 AM

## 2020-12-20 ENCOUNTER — Inpatient Hospital Stay: Payer: Medicaid Other

## 2020-12-20 VITALS — BP 97/60 | HR 66 | Temp 97.5°F | Resp 18

## 2020-12-20 DIAGNOSIS — O99013 Anemia complicating pregnancy, third trimester: Secondary | ICD-10-CM

## 2020-12-20 DIAGNOSIS — E611 Iron deficiency: Secondary | ICD-10-CM | POA: Diagnosis not present

## 2020-12-20 DIAGNOSIS — Z3A28 28 weeks gestation of pregnancy: Secondary | ICD-10-CM | POA: Diagnosis not present

## 2020-12-20 MED ORDER — SODIUM CHLORIDE 0.9 % IV SOLN
Freq: Once | INTRAVENOUS | Status: AC
  Filled 2020-12-20: qty 250

## 2020-12-20 MED ORDER — SODIUM CHLORIDE 0.9 % IV SOLN: 200.0000 mg | Freq: Once | INTRAVENOUS | Status: DC

## 2020-12-20 MED ORDER — IRON SUCROSE 20 MG/ML IV SOLN
200.0000 mg | Freq: Once | INTRAVENOUS | Status: AC
  Administered 2020-12-20: 200 mg via INTRAVENOUS
  Filled 2020-12-20: qty 10

## 2020-12-20 NOTE — Patient Instructions (Signed)

## 2021-01-02 ENCOUNTER — Other Ambulatory Visit: Payer: Self-pay

## 2021-01-02 ENCOUNTER — Ambulatory Visit (INDEPENDENT_AMBULATORY_CARE_PROVIDER_SITE_OTHER): Payer: Medicaid Other | Admitting: Obstetrics and Gynecology

## 2021-01-02 VITALS — BP 118/62 | Wt 151.0 lb

## 2021-01-02 DIAGNOSIS — Z3483 Encounter for supervision of other normal pregnancy, third trimester: Secondary | ICD-10-CM

## 2021-01-02 DIAGNOSIS — Z3A34 34 weeks gestation of pregnancy: Secondary | ICD-10-CM

## 2021-01-02 NOTE — Progress Notes (Signed)
    Routine Prenatal Care Visit  Subjective  Erica Ramirez is a 35 y.o. [id number] at [redacted]w[redacted]d being seen today for ongoing prenatal care.  She is currently monitored for the following issues for this low-risk pregnancy and has Supervision of other normal pregnancy, antepartum and Anemia during pregnancy in third trimester on their problem list.  ----------------------------------------------------------------------------------- Patient reports lower abdominal pain on R side - started on drive to office when patient turned in car.   Contractions: Not present. Vag. Bleeding: None.  Movement: Present. Denies leaking of fluid.  ----------------------------------------------------------------------------------- The following portions of the patient's history were reviewed and updated as appropriate: allergies, current medications, past family history, past medical history, past social history, past surgical history and problem list. Problem list updated.   Objective  Blood pressure 118/62, weight 151 lb (68.5 kg), last menstrual period 05/02/2020. Pregravid weight 110 lb (49.9 kg) Total Weight Gain 41 lb (18.6 kg) Urinalysis:      Fetal Status: Fetal Heart Rate (bpm): 139 Fundal Height: 34 cm Movement: Present     General:  Alert, oriented and cooperative. Patient is in no acute distress.  Skin: Skin is warm and dry. No rash noted.   Cardiovascular: Normal heart rate noted  Respiratory: Normal respiratory effort, no problems with respiration noted  Abdomen: Soft, gravid, appropriate for gestational age. Pain/Pressure: Present     Pelvic:  Cervical exam deferred        Extremities: Normal range of motion.  Edema: None  ental Status: Normal mood and affect. Normal behavior. Normal judgment and thought content.    Assessment   35 y.o. [id number] at [redacted]w[redacted]d by  02/09/2021, by Ultrasound presenting for routine prenatal visit  Plan   Sixth Problems (from 07/11/20 to present)    Problem Noted  Resolved   Supervision of other normal pregnancy, antepartum 07/11/2020 by Rod Can, CNM No   Overview Addendum 12/19/2020 11:41 AM by Gae Dry, MD     Nursing Staff Provider  Office Location  [facility] Dating   [place]  Language  English Anatomy US   [facility]  Flu Vaccine  decl Genetic Screen  NIPS:   AFP:   First Screen:  Neg MaternT-xy  TDaP vaccine   12/19/20 Hgb A1C or  GTT Third trimester :168, 3 hour nml   Rhogam   11/21/20   LAB RESULTS   Feeding Plan  breast Blood Type O NEG  Contraception  None desired Antibody NEG  Circumcision  Rubella  immune  Pediatrician   RPR   NR  Support Person  Andrew HBsAg   negative  Prenatal Classes  HIV  NR    Varicella  immune  BTL Consent  GBS  (For PCN allergy, check sensitivities)        VBAC Consent n/a Pap  2018 NIL     Hgb Electro      CF      SMA   Covid Positive  07/31/2020     Pregnancy Diagnoses Covid in Pregnancy       Previous Version      -Follow-up growth [place] scheduled  Preterm obstetric precautions including but not limited to vaginal bleeding, contractions, leaking of fluid and fetal movement were reviewed in detail with the patient.    Keep previously scheduled follow-up.  Orlie Pollen, CNM, MSN [facility], [facility] 01/02/2021, 11:44 AM

## 2021-01-15 ENCOUNTER — Ambulatory Visit: Payer: Medicaid Other | Attending: Obstetrics and Gynecology

## 2021-01-17 ENCOUNTER — Inpatient Hospital Stay: Payer: Medicaid Other | Attending: Oncology

## 2021-01-17 ENCOUNTER — Encounter: Payer: Medicaid Other | Admitting: Obstetrics & Gynecology

## 2021-01-20 ENCOUNTER — Telehealth: Payer: Self-pay

## 2021-01-20 NOTE — Telephone Encounter (Signed)
Patient is scheduled for MD/poss Venofer tomorrow but she did not come for her labs on 6/17.  Please contact pt to r/s the lab (2 days prior)/MD poss Venofer.

## 2021-01-21 ENCOUNTER — Encounter: Payer: Self-pay | Admitting: Oncology

## 2021-01-21 ENCOUNTER — Inpatient Hospital Stay: Payer: Medicaid Other | Admitting: Oncology

## 2021-01-21 ENCOUNTER — Inpatient Hospital Stay: Payer: Medicaid Other

## 2021-02-10 DIAGNOSIS — Z3A40 40 weeks gestation of pregnancy: Secondary | ICD-10-CM | POA: Diagnosis not present

## 2021-02-10 DIAGNOSIS — R1030 Lower abdominal pain, unspecified: Secondary | ICD-10-CM | POA: Diagnosis not present

## 2021-02-10 DIAGNOSIS — O99891 Other specified diseases and conditions complicating pregnancy: Secondary | ICD-10-CM | POA: Diagnosis not present

## 2021-02-11 DIAGNOSIS — O471 False labor at or after 37 completed weeks of gestation: Secondary | ICD-10-CM | POA: Diagnosis not present

## 2021-02-11 DIAGNOSIS — Z3A40 40 weeks gestation of pregnancy: Secondary | ICD-10-CM | POA: Diagnosis not present

## 2021-02-15 DIAGNOSIS — Z3A40 40 weeks gestation of pregnancy: Secondary | ICD-10-CM | POA: Diagnosis not present

## 2021-02-15 DIAGNOSIS — O360139 Maternal care for anti-D [Rh] antibodies, third trimester, other fetus: Secondary | ICD-10-CM | POA: Diagnosis not present

## 2021-02-15 DIAGNOSIS — O09523 Supervision of elderly multigravida, third trimester: Secondary | ICD-10-CM | POA: Diagnosis not present

## 2021-08-13 ENCOUNTER — Emergency Department
Admission: EM | Admit: 2021-08-13 | Discharge: 2021-08-13 | Disposition: A | Discharge status: Home / Self Care | Payer: Medicaid Other | Attending: Emergency Medicine | Admitting: Emergency Medicine

## 2021-08-13 ENCOUNTER — Encounter: Payer: Self-pay | Admitting: Oncology

## 2021-08-13 ENCOUNTER — Encounter: Payer: Self-pay | Admitting: Emergency Medicine

## 2021-08-13 ENCOUNTER — Other Ambulatory Visit: Payer: Self-pay

## 2021-08-13 ENCOUNTER — Emergency Department: Payer: Medicaid Other

## 2021-08-13 DIAGNOSIS — D250 Submucous leiomyoma of uterus: Secondary | ICD-10-CM | POA: Diagnosis not present

## 2021-08-13 DIAGNOSIS — B9689 Other specified bacterial agents as the cause of diseases classified elsewhere: Secondary | ICD-10-CM | POA: Diagnosis not present

## 2021-08-13 DIAGNOSIS — N9489 Other specified conditions associated with female genital organs and menstrual cycle: Secondary | ICD-10-CM | POA: Insufficient documentation

## 2021-08-13 DIAGNOSIS — N939 Abnormal uterine and vaginal bleeding, unspecified: Secondary | ICD-10-CM

## 2021-08-13 DIAGNOSIS — N760 Acute vaginitis: Secondary | ICD-10-CM | POA: Insufficient documentation

## 2021-08-13 DIAGNOSIS — Z20822 Contact with and (suspected) exposure to covid-19: Secondary | ICD-10-CM | POA: Insufficient documentation

## 2021-08-13 DIAGNOSIS — D259 Leiomyoma of uterus, unspecified: Secondary | ICD-10-CM | POA: Diagnosis not present

## 2021-08-13 LAB — HCG, QUANTITATIVE, PREGNANCY: hCG, Beta Chain, Quant, S: 1 m[IU]/mL (ref <5)

## 2021-08-13 LAB — CBC WITH DIFFERENTIAL/PLATELET
Abs Immature Granulocytes: 0.01 10*3/uL (ref 0.00–0.07)
Basophils Absolute: 0.1 10*3/uL (ref 0.0–0.1)
Basophils Relative: 1 %
Eosinophils Absolute: 0.2 10*3/uL (ref 0.0–0.5)
Eosinophils Relative: 4 %
HCT: 36 % (ref 36.0–46.0)
Hemoglobin: 12.2 g/dL (ref 12.0–15.0)
Immature Granulocytes: 0 %
Lymphocytes Relative: 33 %
Lymphs Abs: 1.8 10*3/uL (ref 0.7–4.0)
MCH: 33.2 pg (ref 26.0–34.0)
MCHC: 33.9 g/dL (ref 30.0–36.0)
MCV: 97.8 fL (ref 80.0–100.0)
Monocytes Absolute: 0.4 10*3/uL (ref 0.1–1.0)
Monocytes Relative: 7 %
Neutro Abs: 3 10*3/uL (ref 1.7–7.7)
Neutrophils Relative %: 55 %
Platelets: 223 10*3/uL (ref 150–400)
RBC: 3.68 MIL/uL — ABNORMAL LOW (ref 3.87–5.11)
RDW: 13.6 % (ref 11.5–15.5)
WBC: 5.5 10*3/uL (ref 4.0–10.5)
nRBC: 0 % (ref 0.0–0.2)

## 2021-08-13 LAB — RESP PANEL BY RT-PCR (FLU A&B, COVID) ARPGX2
Influenza A by PCR: NEGATIVE
Influenza B by PCR: NEGATIVE
SARS Coronavirus 2 by RT PCR: NEGATIVE

## 2021-08-13 LAB — COMPREHENSIVE METABOLIC PANEL
ALT: 60 U/L — ABNORMAL HIGH (ref 0–44)
AST: 41 U/L (ref 15–41)
Albumin: 4.6 g/dL (ref 3.5–5.0)
Alkaline Phosphatase: 76 U/L (ref 38–126)
Anion gap: 4 — ABNORMAL LOW (ref 5–15)
BUN: 15 mg/dL (ref 6–20)
CO2: 27 mmol/L (ref 22–32)
Calcium: 8.7 mg/dL — ABNORMAL LOW (ref 8.9–10.3)
Chloride: 106 mmol/L (ref 98–111)
Creatinine, Ser: 1.1 mg/dL — ABNORMAL HIGH (ref 0.44–1.00)
GFR, Estimated: >60 mL/min (ref >60)
Glucose, Bld: 79 mg/dL (ref 70–99)
Potassium: 4 mmol/L (ref 3.5–5.1)
Sodium: 137 mmol/L (ref 135–145)
Total Bilirubin: 0.8 mg/dL (ref 0.3–1.2)
Total Protein: 8 g/dL (ref 6.5–8.1)

## 2021-08-13 LAB — URINALYSIS, ROUTINE W REFLEX MICROSCOPIC
Bilirubin Urine: NEGATIVE
Glucose, UA: NEGATIVE mg/dL
Ketones, ur: 5 mg/dL — AB
Leukocytes,Ua: NEGATIVE
Nitrite: NEGATIVE
Protein, ur: NEGATIVE mg/dL
Specific Gravity, Urine: 1.024 (ref 1.005–1.030)
pH: 5 (ref 5.0–8.0)

## 2021-08-13 LAB — WET PREP, GENITAL
Sperm: NONE SEEN
Trich, Wet Prep: NONE SEEN
WBC, Wet Prep HPF POC: <10 (ref <10)
Yeast Wet Prep HPF POC: NONE SEEN

## 2021-08-13 MED ORDER — METRONIDAZOLE 500 MG PO TABS: 500.0000 mg | ORAL_TABLET | Freq: Two times a day (BID) | ORAL | 0 refills | Status: DC

## 2021-08-13 NOTE — Discharge Instructions (Addendum)
Follow-up with your regular doctor or Dr. Ouida Sills at [facility]. Your ultrasound showed fibroids in the mucosa of the uterus.  These can cause heavy bleeding and some pain For the bacterial vaginosis, if you are breast-feeding just take a probiotic pill, if you are not breast-feeding he could take the Flagyl that was prescribed. Return to the emergency department if you feel that you are worsening

## 2021-08-13 NOTE — ED Triage Notes (Signed)
Pt comes into the ED via POV c/o vaginal bleeding every 3-5 days for a day.  Pt this morning passed a large blood clot.  Pt denies that she isn't pregnant.  Pt had recent pregnancy 6 months ago.  Pt in NAD at this time with even and unlabored respirations.

## 2021-08-13 NOTE — ED Provider Notes (Signed)
[facility] Provider Note    Event Date/Time   First MD Initiated Contact with Patient 08/13/21 1008     (approximate)   History   Vaginal Bleeding   HPI  Erica Ramirez is a 36 y.o. female G6 P2 03 2 presents to the emergency department due to heavy vaginal bleeding.  Patient is postpartum x6 months and did not do her 6-week follow-up.  She states she has had difficulty trying to have sex due to the pain.  She has an odor in the vaginal area.  States she is unsure if she passed the complete placenta.  However patient has not had fever or chills.  States she has been bleeding every 3 to 5 days since she delivered her child.  Feels weak and jittery.  Feels dizziness.  She denies chest pain or shortness of breath.  No abdominal pain.  Patient did require iron infusions during her pregnancy      Physical Exam   Triage Vital Signs: ED Triage Vitals  Enc Vitals Group     BP 08/13/21 0957 112/74     Pulse Rate 08/13/21 0957 76     Resp 08/13/21 0957 17     Temp 08/13/21 0957 98.1 F (36.7 C)     Temp Source 08/13/21 0957 Oral     SpO2 08/13/21 0957 95 %     Weight 08/13/21 0935 151 lb 0.2 oz (68.5 kg)     Height 08/13/21 0935 5\' 7"  (1.702 m)     Head Circumference --      Peak Flow --      Pain Score 08/13/21 0935 0     Pain Loc --      Pain Edu? --      Excl. in [place]? --     Most recent vital signs: Vitals:   08/13/21 0957  BP: 112/74  Pulse: 76  Resp: 17  Temp: 98.1 F (36.7 C)  SpO2: 95%     General: Awake, no distress.   CV:  Good peripheral perfusion.  Regular rate and rhythm Resp:  Normal effort.  Lungs CTA Abd:  No distention.  Abdomen is nontender, bowel sounds normal Other:  GU deferred by the patient   ED Results / Procedures / Treatments   Labs (all labs ordered are listed, but only abnormal results are displayed) Labs Reviewed  WET PREP, GENITAL - Abnormal; Notable for the following components:      Result Value    Clue Cells Wet Prep HPF POC PRESENT (*)    All other components within normal limits  COMPREHENSIVE METABOLIC PANEL - Abnormal; Notable for the following components:   Creatinine, Ser 1.10 (*)    Calcium 8.7 (*)    ALT 60 (*)    Anion gap 4 (*)    All other components within normal limits  CBC WITH DIFFERENTIAL/PLATELET - Abnormal; Notable for the following components:   RBC 3.68 (*)    All other components within normal limits  URINALYSIS, ROUTINE W REFLEX MICROSCOPIC - Abnormal; Notable for the following components:   Color, Urine YELLOW (*)    APPearance HAZY (*)    Hgb urine dipstick MODERATE (*)    Ketones, ur 5 (*)    Bacteria, UA RARE (*)    All other components within normal limits  RESP PANEL BY RT-PCR (FLU A&B, COVID) ARPGX2  HCG, QUANTITATIVE, PREGNANCY     EKG     RADIOLOGY Ultrasound pelvis due to heavy  vaginal bleeding postpartum    PROCEDURES:  Critical Care performed: No  Procedures   MEDICATIONS ORDERED IN ED: Medications - No data to display   IMPRESSION / MDM / [place] / ED COURSE  I reviewed the triage vital signs and the nursing notes.                              Differential diagnosis includes, but is not limited to, menorrhagia, dysfunctional uterine bleeding, retained products of conception, anemia  Patient is a 36 year old female with significant OB history presents 6 months postpartum with vaginal bleeding.  See HPI.  Physical exam shows patient be stable at this time.  Patient's vitals appear to be normal so do not feel that she is unstable.  Due to the heavy vaginal bleeding I do think we need to examine her chance of anemia and do an ultrasound of the pelvis  I did order CBC, metabolic panel, POC pregnancy, wet prep, urinalysis, and beta-hCG.  We will also order a respiratory panel as patient adds on that her child had COVID last week. Ultrasound pelvis for abnormal uterine bleeding  Wet prep shows clue cells, beta-hCG  is less than 1, respiratory panel is negative, urinalysis is normal, metabolic panel is negative, CBC has normal H&H  Ultrasound does not show any retained products pregnancy, does show fibroids  I did explain the findings to the patient.  I told her the odor is most likely coming from bacterial vaginosis.  She can take Flagyl but she will need to pump and dump for a week.  Otherwise she can take a probiotic which may correct the imbalance in the vaginal area.  Due to the bleeding she should follow-up with GYN.  Pain to her these are fibroids and they may cause bleeding and pain.  She states she understands.  She is in agreement with treatment plan.  Prescription for Flagyl was sent to the pharmacy.  Patient was once again reminded to pump and dump if she takes this medication.  She is discharged in stable condition.        FINAL CLINICAL IMPRESSION(S) / ED DIAGNOSES   Final diagnoses:  BV (bacterial vaginosis)  Fibroids, submucosal     Rx / DC Orders   ED Discharge Orders          Ordered    metroNIDAZOLE (FLAGYL) 500 MG tablet  2 times daily        08/13/21 1242             Note:  This document was prepared using Dragon voice recognition software and may include unintentional dictation errors.    Versie Starks, PA-C 08/13/21 1512    Carrie Mew, MD 08/21/21 2352

## 2021-08-13 NOTE — ED Notes (Signed)
See triage note  presents with some vaginal bleeding   states she had recent pregnancy

## 2021-08-14 ENCOUNTER — Telehealth: Payer: Self-pay

## 2021-08-14 NOTE — Telephone Encounter (Signed)
Transition Care Management Unsuccessful Follow-up Telephone Call  Date of discharge and from where:  08/13/2021 from [facility]  Attempts:  1st Attempt  Reason for unsuccessful TCM follow-up call:  Unable to leave message

## 2021-08-15 NOTE — Telephone Encounter (Signed)
Transition Care Management Unsuccessful Follow-up Telephone Call  Date of discharge and from where:  08/13/2021-[facility]  Attempts:  2nd Attempt  Reason for unsuccessful TCM follow-up call:  Unable to leave message

## 2021-08-16 NOTE — Telephone Encounter (Signed)
Transition Care Management Unsuccessful Follow-up Telephone Call  Date of discharge and from where:  08/13/2021-+[facility]  Attempts:  3rd Attempt  Reason for unsuccessful TCM follow-up call:  Unable to leave message

## 2022-07-02 IMAGING — CT CT HEAD CODE STROKE
3 series · 15 of 45 positions shown, 18 images · non-contrast
Comparison: No pertinent prior exams available for comparison.

CLINICAL DATA: Code stroke. Neuro deficit, acute, stroke suspected.
Additional history provided: Sudden onset feeling faint, weakness,
numbness of left upper extremity.

EXAM:
CT HEAD WITHOUT CONTRAST
TECHNIQUE: Contiguous axial images were obtained from the base of the skull
through the vertex without intravenous contrast.

[Series 2: head wo · axial · 0.41mm/px · z∈[+438,+553]mm · 9 of 28 slices shown, 12 images]
[im 3/28  brain]
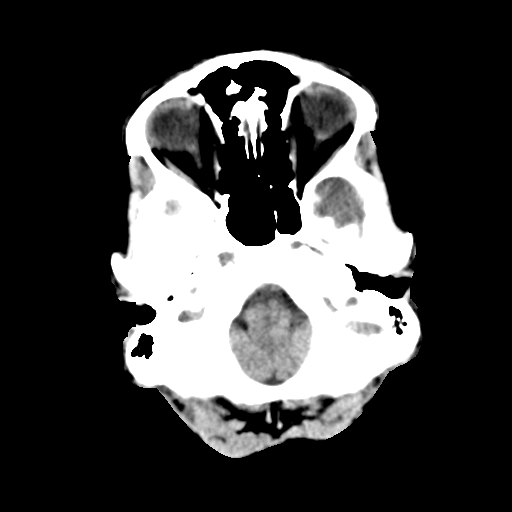
[im 3/28  bone]
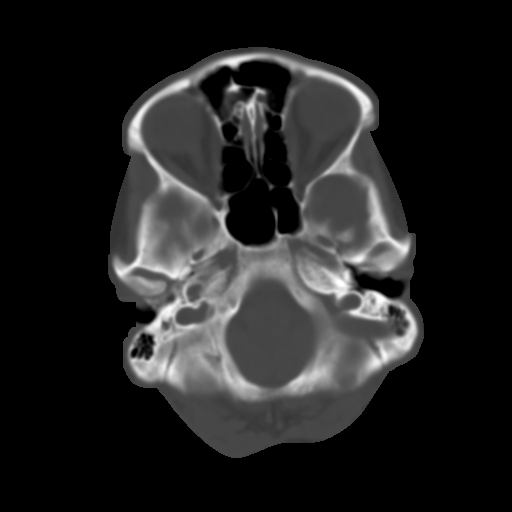
[im 6/28  brain]
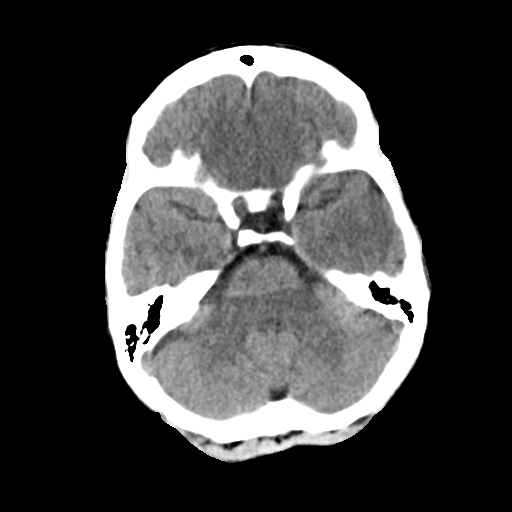
[im 9/28  brain]
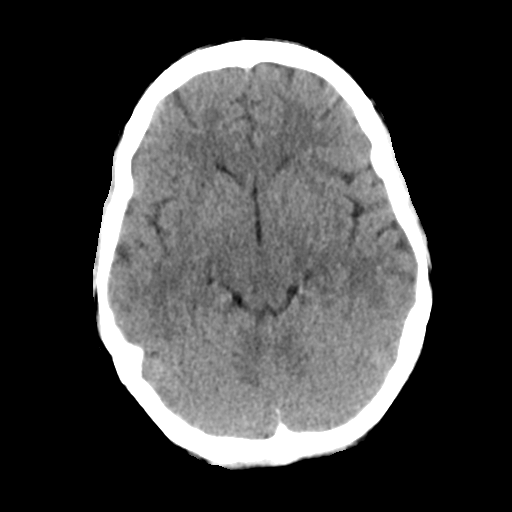
[im 12/28  brain]
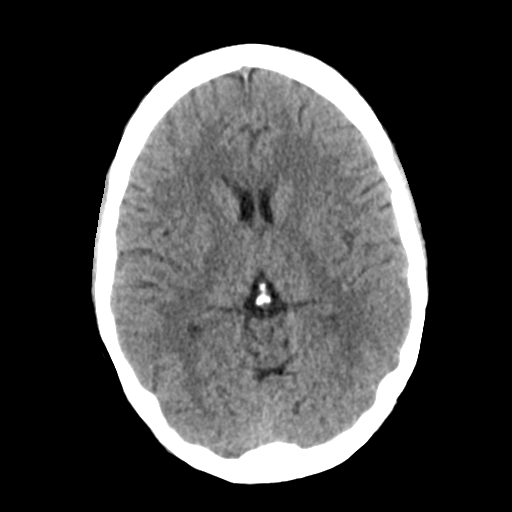
[im 15/28  brain]
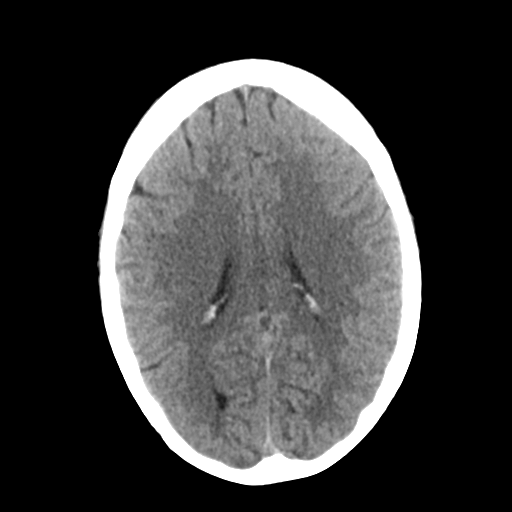
[im 15/28  bone]
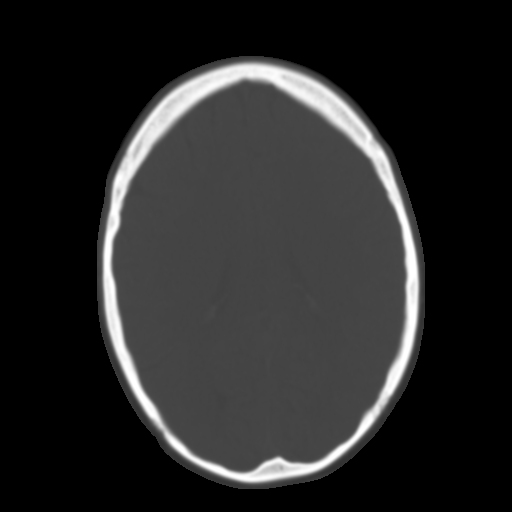
[im 17/28  brain]
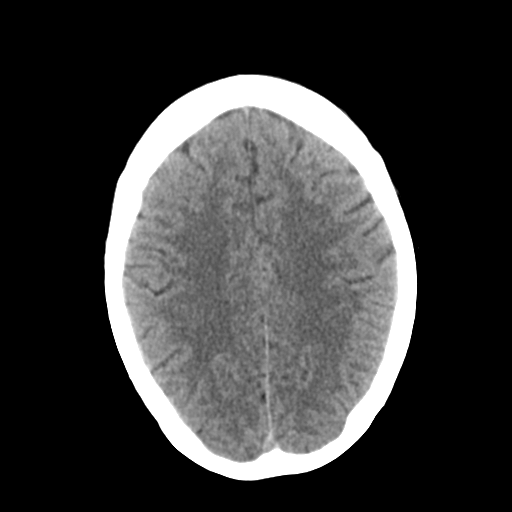
[im 20/28  brain]
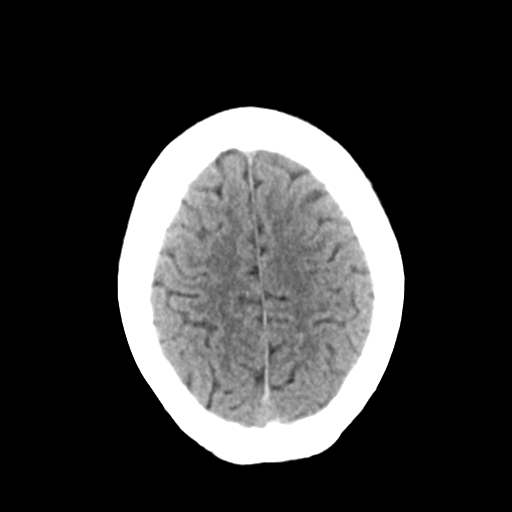
[im 23/28  brain]
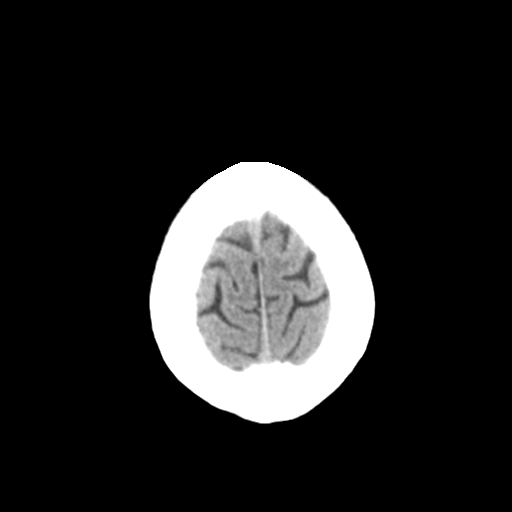
[im 26/28  brain]
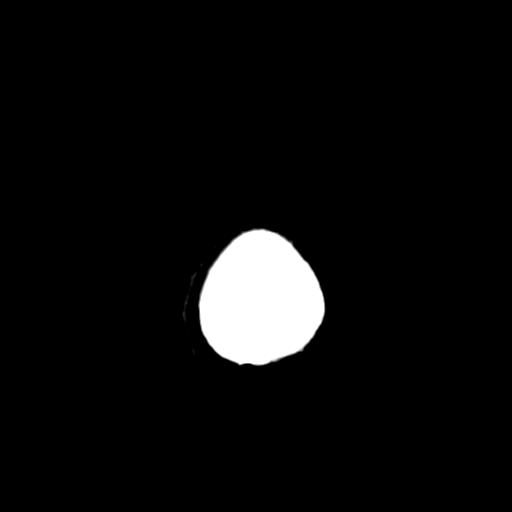
[im 26/28  bone]
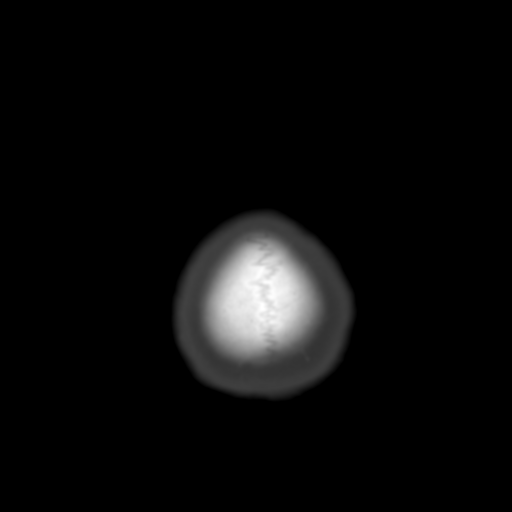

[Series 4: coronal soft tissue · coronal · 0.29mm/px · 3 of 65 slices shown]
[im 22/65  brain]
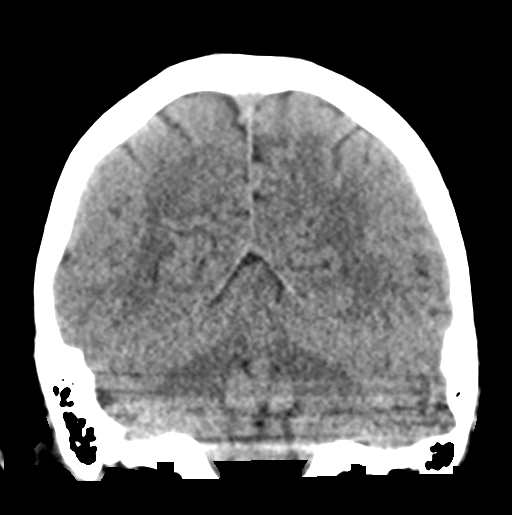
[im 29/65  brain]
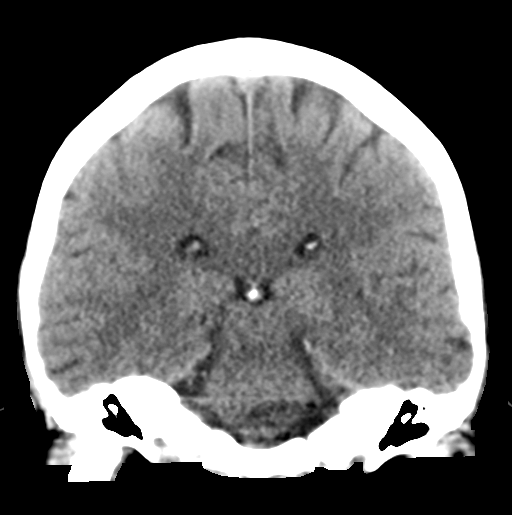
[im 36/65  brain]
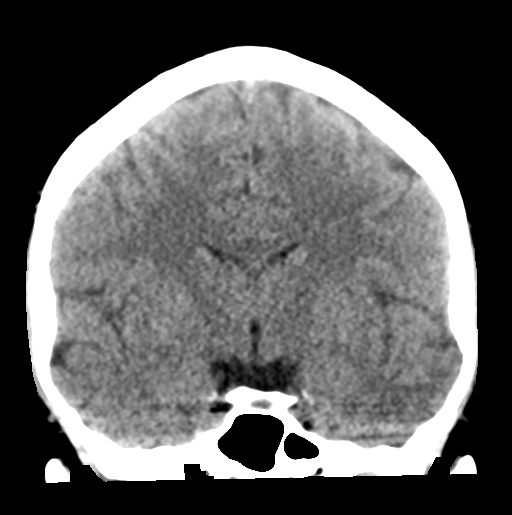

[Series 5: sagittal soft tissue · sagittal · 0.29mm/px · 3 of 50 slices shown]
[im 17/50  brain]
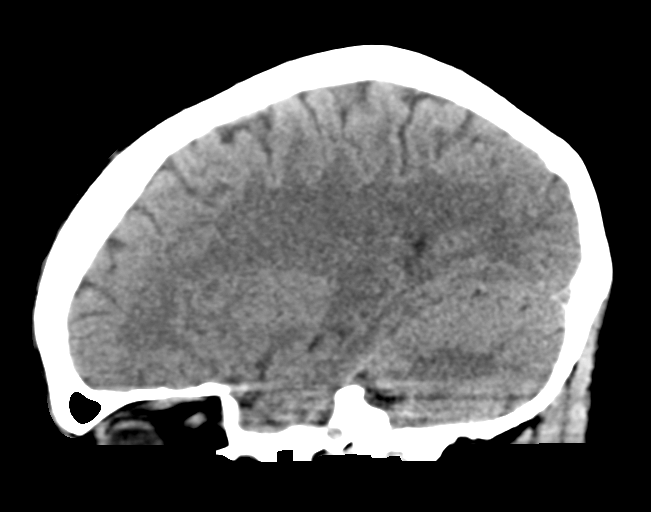
[im 25/50  brain]
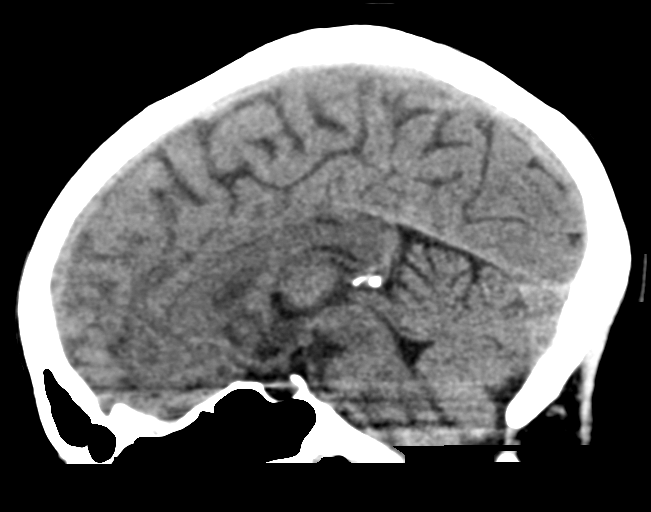
[im 33/50  brain]
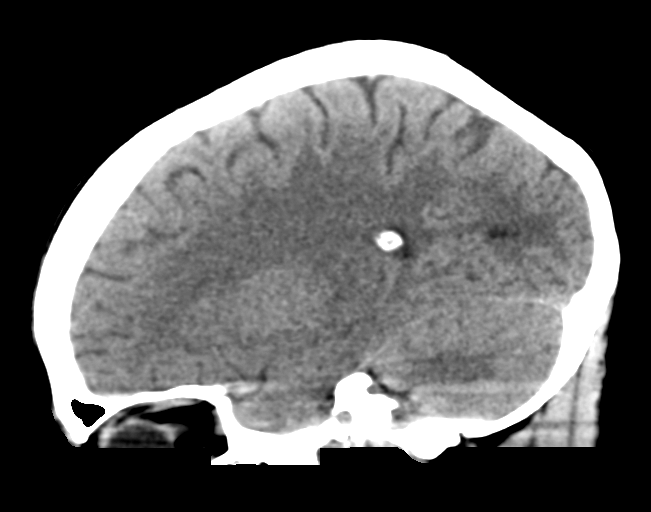

[15 of 45 positions shown; findings below may reference images not displayed]

FINDINGS: Brain:

Cerebral volume is normal.

There is no acute intracranial hemorrhage.

No demarcated cortical infarct.

No extra-axial fluid collection.

No evidence of intracranial mass.

No midline shift.

Vascular: No hyperdense vessel.

Skull: Normal. Negative for fracture or focal lesion.

Sinuses/Orbits: Visualized orbits show no acute finding. Small
volume fluid within a posterior left ethmoid air cell.

ASPECTS (Alberta Stroke Program Early CT Score)

- Ganglionic level infarction (caudate, lentiform nuclei, internal
capsule, insula, M1-M3 cortex): 7

- Supraganglionic infarction (M4-M6 cortex): 3

Total score (0-10 with 10 being normal): 10

These results were called by telephone at the time of interpretation
on 10/04/2020 at [DATE] to provider ARSHIA BELGRAVE , who verbally
acknowledged these results.
IMPRESSION: No evidence of acute intracranial abnormality.  ASPECTS is 10.

Mild left ethmoid sinusitis.

## 2022-07-30 ENCOUNTER — Emergency Department
Admission: EM | Admit: 2022-07-30 | Discharge: 2022-07-30 | Disposition: A | Discharge status: Home / Self Care | Payer: Medicaid Other | Attending: Emergency Medicine | Admitting: Emergency Medicine

## 2022-07-30 ENCOUNTER — Encounter: Payer: Self-pay | Admitting: Oncology

## 2022-07-30 ENCOUNTER — Other Ambulatory Visit: Payer: Self-pay

## 2022-07-30 ENCOUNTER — Emergency Department: Payer: Medicaid Other

## 2022-07-30 ENCOUNTER — Encounter: Payer: Self-pay | Admitting: Emergency Medicine

## 2022-07-30 DIAGNOSIS — M25511 Pain in right shoulder: Secondary | ICD-10-CM | POA: Insufficient documentation

## 2022-07-30 DIAGNOSIS — M7918 Myalgia, other site: Secondary | ICD-10-CM

## 2022-07-30 DIAGNOSIS — Y92410 Unspecified street and highway as the place of occurrence of the external cause: Secondary | ICD-10-CM | POA: Insufficient documentation

## 2022-07-30 DIAGNOSIS — M546 Pain in thoracic spine: Secondary | ICD-10-CM | POA: Diagnosis not present

## 2022-07-30 DIAGNOSIS — Z041 Encounter for examination and observation following transport accident: Secondary | ICD-10-CM | POA: Diagnosis not present

## 2022-07-30 DIAGNOSIS — R519 Headache, unspecified: Secondary | ICD-10-CM | POA: Diagnosis not present

## 2022-07-30 DIAGNOSIS — M542 Cervicalgia: Secondary | ICD-10-CM | POA: Diagnosis not present

## 2022-07-30 DIAGNOSIS — M545 Low back pain, unspecified: Secondary | ICD-10-CM | POA: Diagnosis not present

## 2022-07-30 MED ORDER — HYDROCODONE-ACETAMINOPHEN 5-325 MG PO TABS
1.0000 | ORAL_TABLET | Freq: Four times a day (QID) | ORAL | 0 refills | Status: DC | PRN
Start: 2022-07-30 — End: 2022-08-18

## 2022-07-30 MED ORDER — KETOROLAC TROMETHAMINE 30 MG/ML IJ SOLN
30.0000 mg | Freq: Once | INTRAMUSCULAR | Status: AC
  Administered 2022-07-30: 30 mg via INTRAMUSCULAR
  Filled 2022-07-30: qty 1
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
## 2022-08-18 ENCOUNTER — Emergency Department
Admission: EM | Admit: 2022-08-18 | Discharge: 2022-08-18 | Disposition: A | Discharge status: Home / Self Care | Payer: Medicaid Other | Attending: Student in an Organized Health Care Education/Training Program | Admitting: Student in an Organized Health Care Education/Training Program

## 2022-08-18 ENCOUNTER — Encounter: Payer: Self-pay | Admitting: Emergency Medicine

## 2022-08-18 ENCOUNTER — Other Ambulatory Visit: Payer: Self-pay

## 2022-08-18 DIAGNOSIS — K0381 Cracked tooth: Secondary | ICD-10-CM | POA: Diagnosis not present

## 2022-08-18 DIAGNOSIS — K0889 Other specified disorders of teeth and supporting structures: Secondary | ICD-10-CM | POA: Diagnosis not present

## 2022-08-18 DIAGNOSIS — R519 Headache, unspecified: Secondary | ICD-10-CM | POA: Diagnosis present

## 2022-08-18 IMAGING — US US OB COMP +14 WK
1 series · 13 of 28 positions shown · non-contrast
Comparison: none

CLINICAL DATA: Third trimester pregnancy for fetal anatomy survey
and growth evaluation.

EXAM:
OBSTETRICAL ULTRASOUND >14 WKS

[Series 1: us ob comp + 14 wk · 82 acquisitions, 13 frames shown]
[im 4/82]
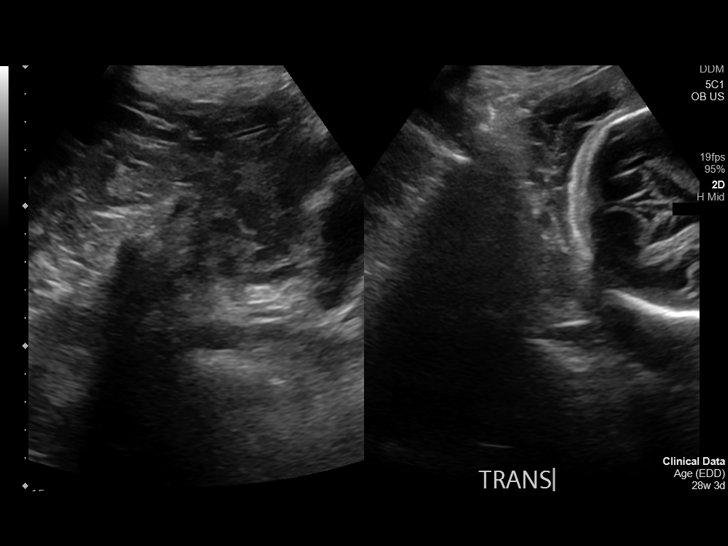
[im 10/82]
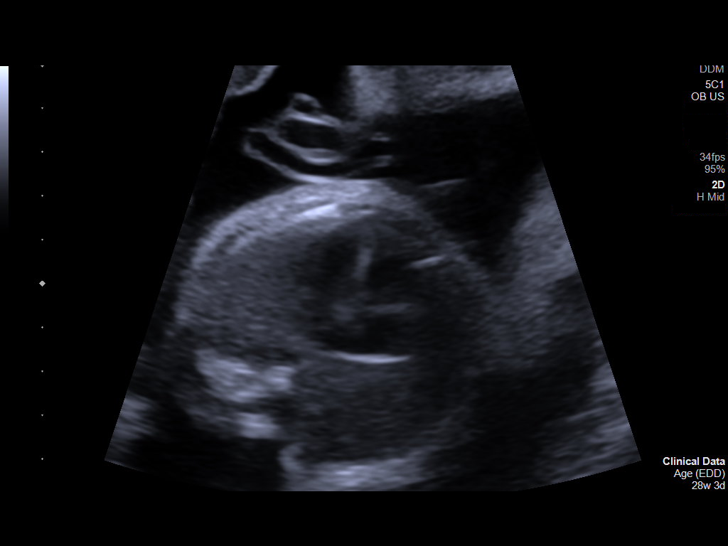
[im 16/82]
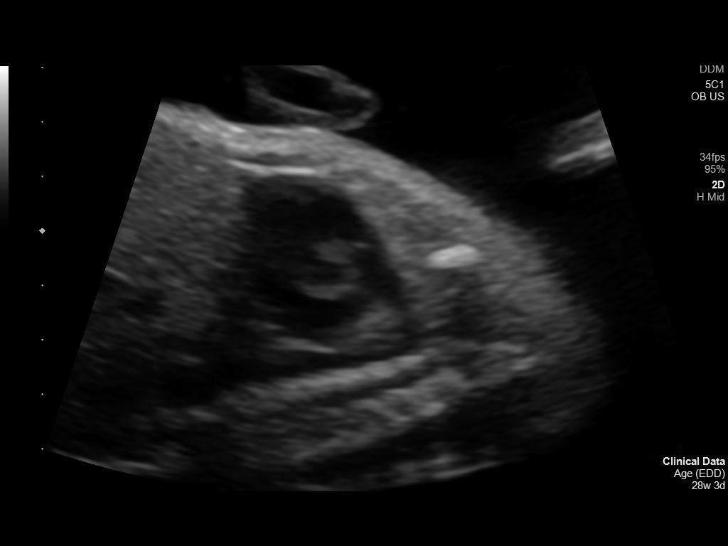
[im 22/82]
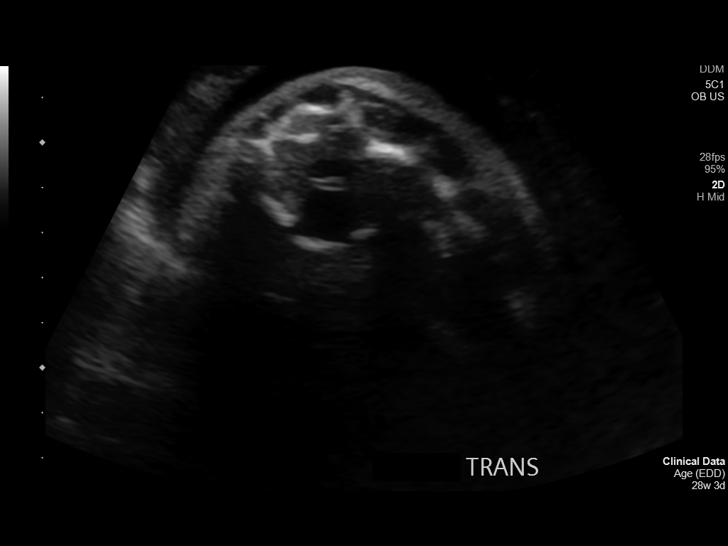
[im 28/82]
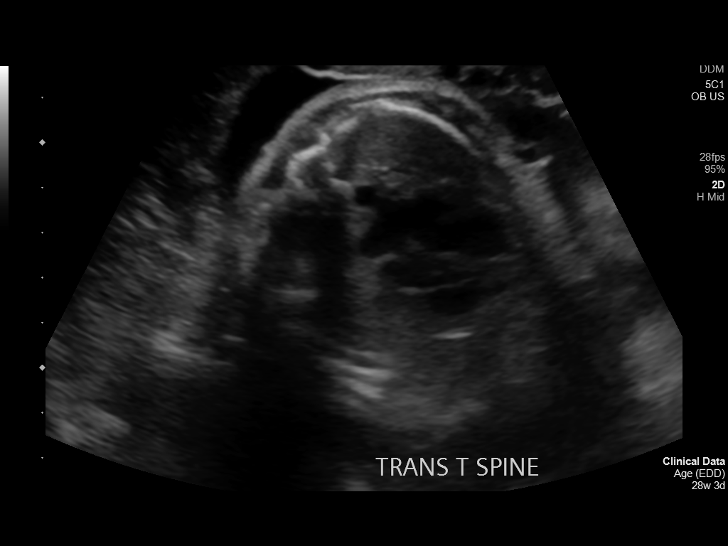
[im 34/82]
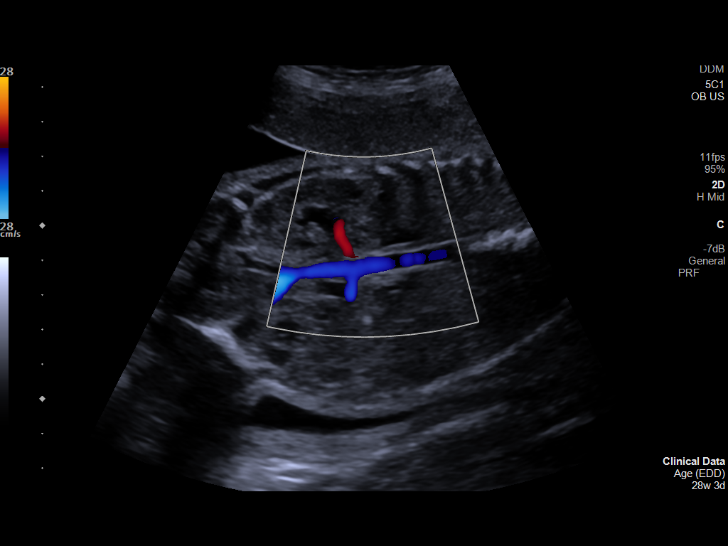
[im 43/82]
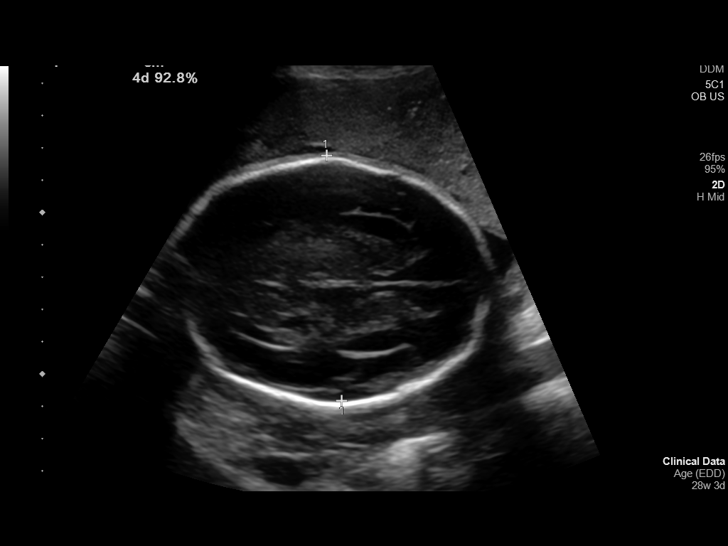
[im 49/82]
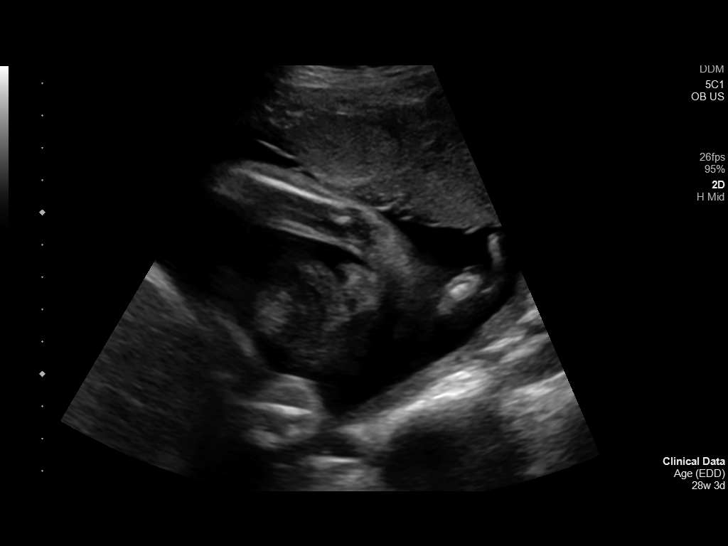
[im 55/82]
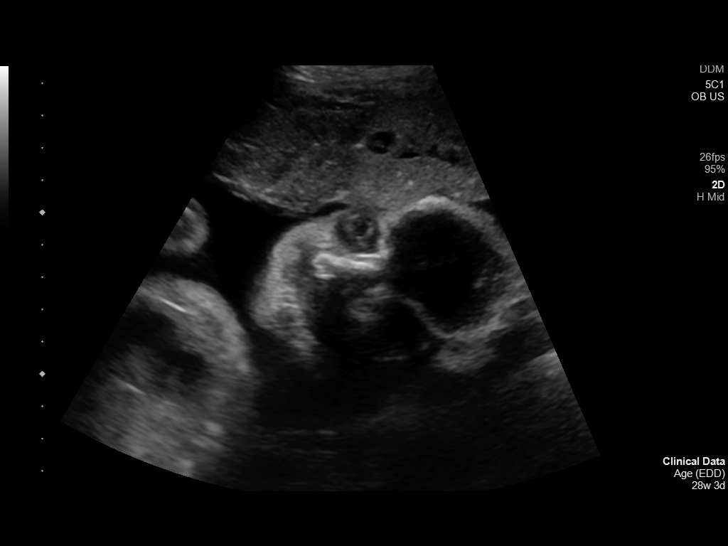
[im 61/82]
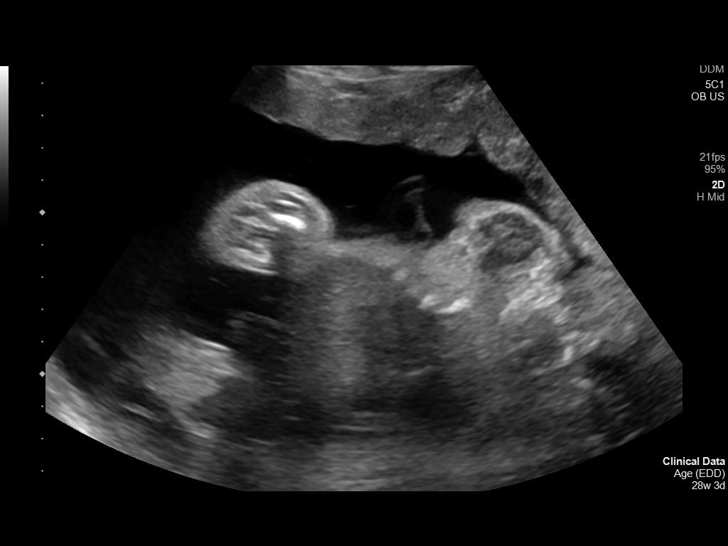
[im 67/82]
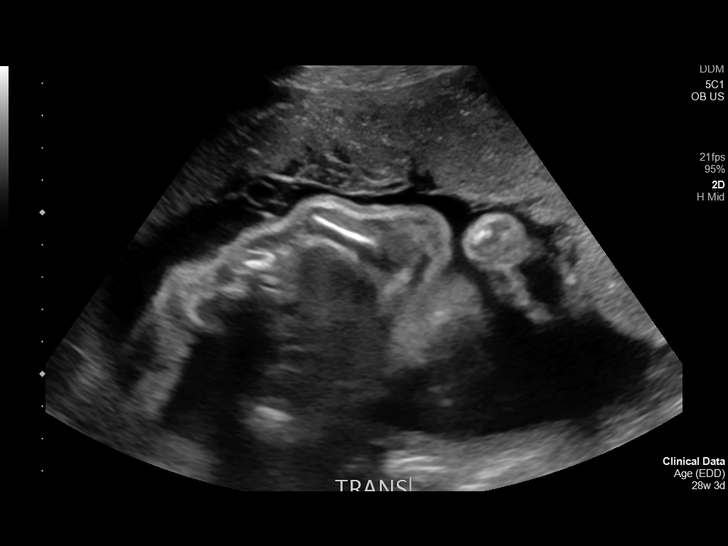
[im 73/82]
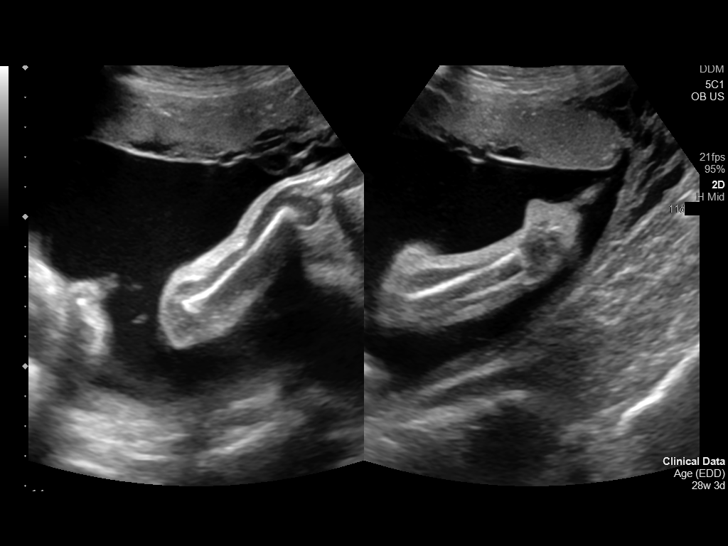
[im 79/82]
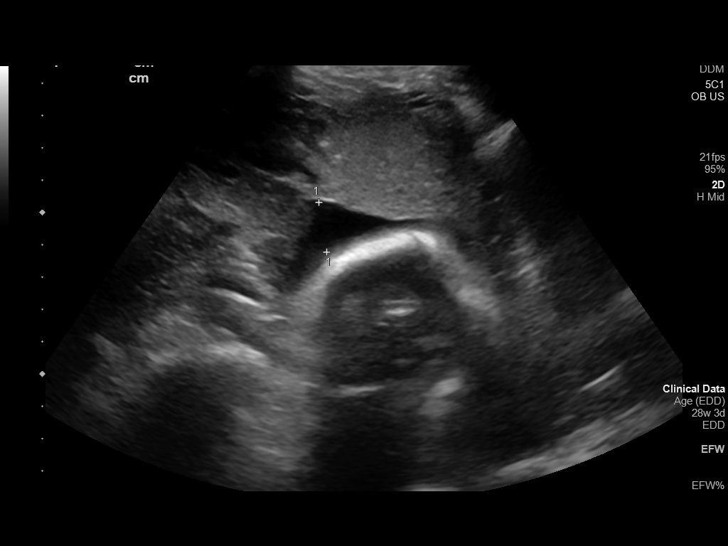

[13 of 28 positions shown; findings below may reference images not displayed]

FINDINGS: Number of Fetuses: 1

Heart Rate:  147 bpm

Movement: Yes

Presentation: Cephalic

Previa: No

Placental Location: Anterior

Amniotic Fluid (Subjective): Within normal limits

Amniotic Fluid (Objective):

Vertical pocket = 7.3cm

AFI = 16.9 cm (5%ile= 9.8 cm, 95%= 22.8 cm for 20 wks)

FETAL BIOMETRY

BPD: 7.6cm 30w 4d

HC:   28.3cm 31w 1d

AC:   25.3cm 29w 4d

FL:   5.7cm 29w 6d

Current Mean GA: 30w 0d US EDC: 01/29/2021

Assigned GA:  28w 3d Assigned EDC: 02/09/2021

Estimated Fetal Weight:  1,469g 88%ile

FETAL ANATOMY

Lateral Ventricles: Appears normal

Thalami/CSP: Appears normal

Posterior Fossa:  Appears normal

Nuchal Region: Appears normal   NFT= N/A > 20 WKS

Upper Lip: Appears normal

Spine: Appears normal

4 Chamber Heart on Left: Appears normal

LVOT: Appears normal

RVOT: Appears normal

Stomach on Left: Appears normal

3 Vessel Cord: Appears normal

Cord Insertion site: Appears normal

Kidneys: Appears normal

Bladder: Appears normal

Extremities: Appears normal

Sex: Male

Maternal Findings:

Cervix:  4.8 cm TA
IMPRESSION: Assigned GA currently 28 weeks 3 days. EFW is currently at 88 %ile,
and developing large for gestational age fetus cannot be excluded.
Recommend continued ultrasound follow-up of fetal growth in 4-6
weeks.

Unremarkable fetal anatomic survey.  No fetal anomalies identified.

Amniotic fluid volume within normal limits.

## 2022-08-18 MED ORDER — CHLORHEXIDINE GLUCONATE 0.12 % MT SOLN: 15.0000 mL | Freq: Two times a day (BID) | OROMUCOSAL | 0 refills | Status: AC

## 2022-08-18 MED ORDER — AMOXICILLIN-POT CLAVULANATE 875-125 MG PO TABS: 1.0000 | ORAL_TABLET | Freq: Two times a day (BID) | ORAL | 0 refills | Status: AC

## 2022-08-18 NOTE — Discharge Instructions (Signed)
Please take the medications as prescribed.  Please follow-up with your dentist.  Please return for any new, worsening, or change in symptoms or other concerns.  It was a pleasure caring for you today.
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
## 2022-08-18 NOTE — ED Provider Notes (Signed)
[facility] Provider Note    Event Date/Time   First MD Initiated Contact with Patient 08/18/22 1636     (approximate)   History   Headache   HPI  Bular Hickok is a 37 y.o. female who presents today for evaluation of dental pain for the past 3 days.  She reports that the pain radiates up into her ear.  She denies any change in her hearing or discharge from her ear.  She has not had any pain opening her mouth.  She reports that she has bad teeth, and is able to identify 1 tooth that is of particular concern to her.  She reports that she was just able to contact the dentist today and has an appointment for later this week.  No trouble swallowing.  No voice changes.  No trouble breathing.  No fevers or chills.  No facial or neck swelling.  Patient Active Problem List   Diagnosis Date Noted   Anemia during pregnancy in third trimester 11/14/2020   Supervision of other normal pregnancy, antepartum 07/11/2020          Physical Exam   Triage Vital Signs: ED Triage Vitals  Enc Vitals Group     BP 08/18/22 1606 122/75     Pulse Rate 08/18/22 1606 69     Resp 08/18/22 1606 18     Temp 08/18/22 1606 98.4 F (36.9 C)     Temp Source 08/18/22 1606 Oral     SpO2 08/18/22 1606 99 %     Weight 08/18/22 1605 150 lb (68 kg)     Height --      Head Circumference --      Peak Flow --      Pain Score 08/18/22 1604 7     Pain Loc --      Pain Edu? --      Excl. in [employer]? --     Most recent vital signs: Vitals:   08/18/22 1606  BP: 122/75  Pulse: 69  Resp: 18  Temp: 98.4 F (36.9 C)  SpO2: 99%    Physical Exam Vitals and nursing note reviewed.  Constitutional:      General: Awake and alert. No acute distress.    Appearance: Normal appearance. The patient is normal weight.  HENT:     Head: Normocephalic and atraumatic.     Mouth: Mucous membranes are moist. Tooth #4 with tap tenderness and gingival erythema without gingival fluctuance.  There  is also dental decay noted to the bottom front teeth without fluctuance.  No sublingual woodiness or tenderness or swelling.  No facial or neck swelling or tenderness. TMs clear bilaterally.  No otorrhea.  Normal canals.  No mastoid tenderness or erythema.  Scarring noted to right TM Eyes:     General: PERRL. Normal EOMs        Right eye: No discharge.        Left eye: No discharge.     Conjunctiva/sclera: Conjunctivae normal.  Cardiovascular:     Rate and Rhythm: Normal rate and regular rhythm.     Pulses: Normal pulses.  Pulmonary:     Effort: Pulmonary effort is normal. No respiratory distress.     Breath sounds: Normal breath sounds.  Abdominal:     Abdomen is soft. There is no abdominal tenderness. No rebound or guarding. No distention. Musculoskeletal:        General: No swelling. Normal range of motion.     Cervical back:  Normal range of motion and neck supple.  Skin:    General: Skin is warm and dry.     Capillary Refill: Capillary refill takes less than 2 seconds.     Findings: No rash.  Neurological:     Mental Status: The patient is awake and alert.      ED Results / Procedures / Treatments   Labs (all labs ordered are listed, but only abnormal results are displayed) Labs Reviewed - No data to display   EKG     RADIOLOGY     PROCEDURES:  Critical Care performed:   Procedures   MEDICATIONS ORDERED IN ED: Medications - No data to display   IMPRESSION / MDM / [place] / ED COURSE  I reviewed the triage vital signs and the nursing notes.   Differential diagnosis includes, but is not limited to, pulpitis, dental caries, dental decay, otitis media, otitis externa, abscess.  Patient was evaluated in the emergency department for dental pain. Patient has tenderness over 1 of her teeth and poor dentition, I suspect some dental caries vs pulpitits. No gingival swelling or fluctuance concerning for gingival abscess.  No trismus, nuchal rigidity,  neck pain, hot potato voice, uvular deviation or malocclusion to suggest deep space infection. No sublingual swelling concerning for Ludwig's angina.  TMs clear bilaterally, normal canals, no signs or symptoms of otitis media, otitis externa, or mastoiditis.  She is scarring noted to her right TM and notes that she has had tubes as a child.  Patient was started on antibiotics and chlorhexidine mouth rinse.  Patient was treated symptomatically in the emergency department. Discussed care plan, return precautions, and advised close outpatient follow-up with dentist. Patient agrees with plan of care.  She was discharged in stable condition.    Patient's presentation is most consistent with acute illness / injury with system symptoms.   FINAL CLINICAL IMPRESSION(S) / ED DIAGNOSES   Final diagnoses:  Pain, dental     Rx / DC Orders   ED Discharge Orders          Ordered    amoxicillin-clavulanate (AUGMENTIN) 875-125 MG tablet  2 times daily        08/18/22 1650    chlorhexidine (PERIDEX) 0.12 % solution  2 times daily        08/18/22 1650             Note:  This document was prepared using Dragon voice recognition software and may include unintentional dictation errors.   Emeline Gins 08/18/22 1945    Merlyn Lot, MD 08/18/22 2112

## 2023-05-11 IMAGING — US US PELVIS COMPLETE WITH TRANSVAGINAL
1 series · 15 of 25 positions shown · non-contrast
Comparison: None

CLINICAL DATA: Vaginal bleeding for 6 months

EXAM:
TRANSABDOMINAL AND TRANSVAGINAL ULTRASOUND OF PELVIS
TECHNIQUE: Both transabdominal and transvaginal ultrasound examinations of the
pelvis were performed. Transabdominal technique was performed for
global imaging of the pelvis including uterus, ovaries, adnexal
regions, and pelvic cul-de-sac. It was necessary to proceed with
endovaginal exam following the transabdominal exam to visualize the
ovaries and endometrium.

[Series 1: us pelvis complete · 15 of 81 slices shown]
[im 1/81]
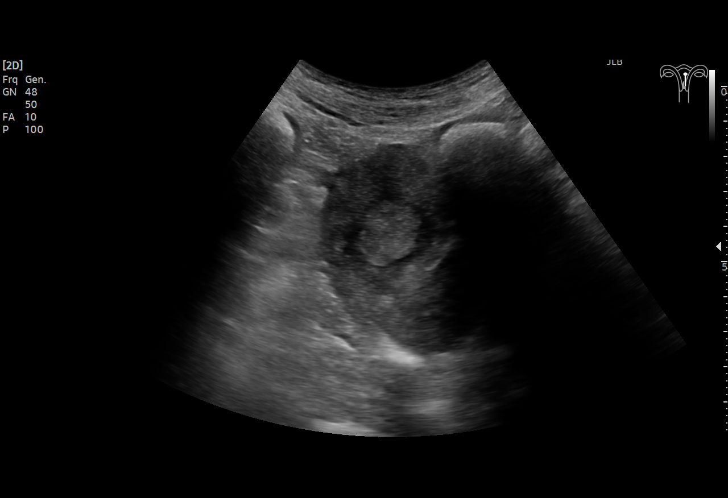
[im 7/81]
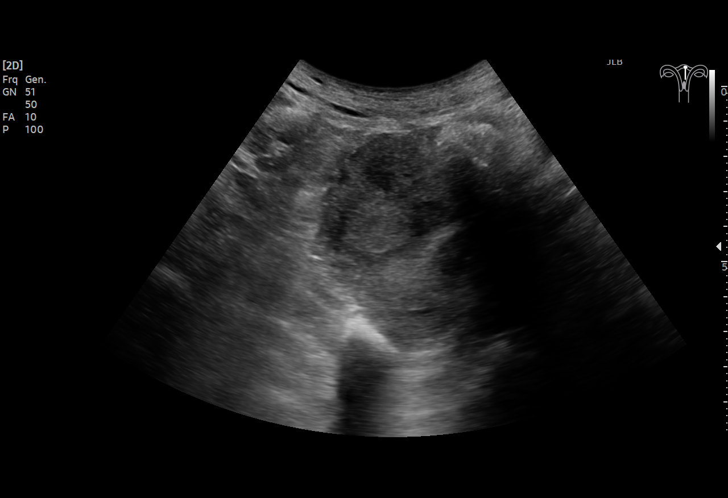
[im 14/81]
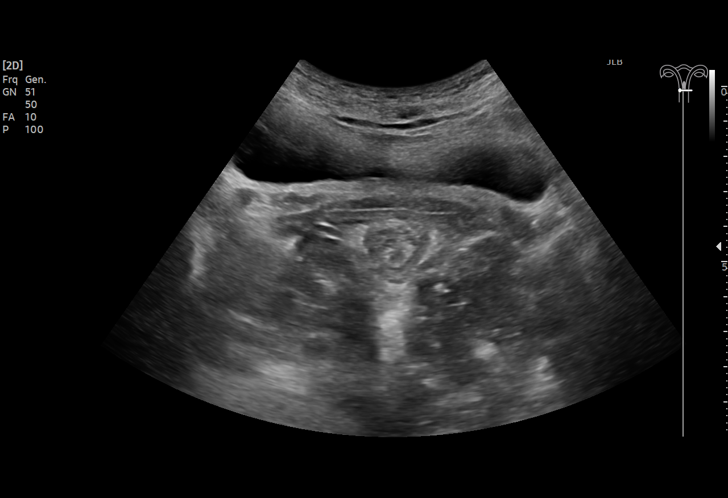
[im 17/81]
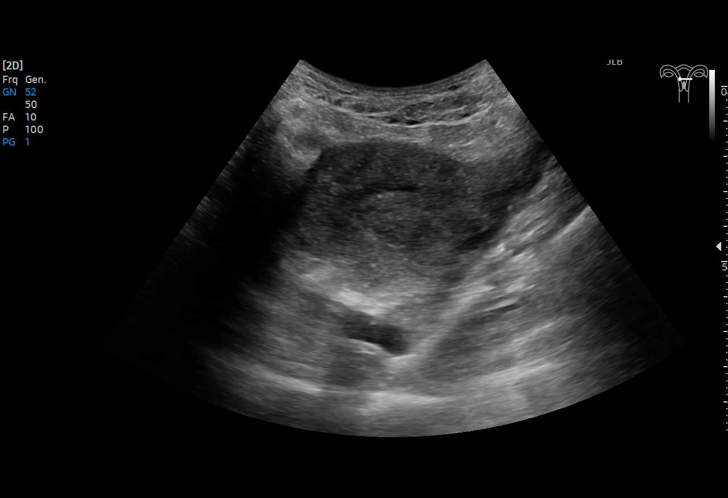
[im 24/81]
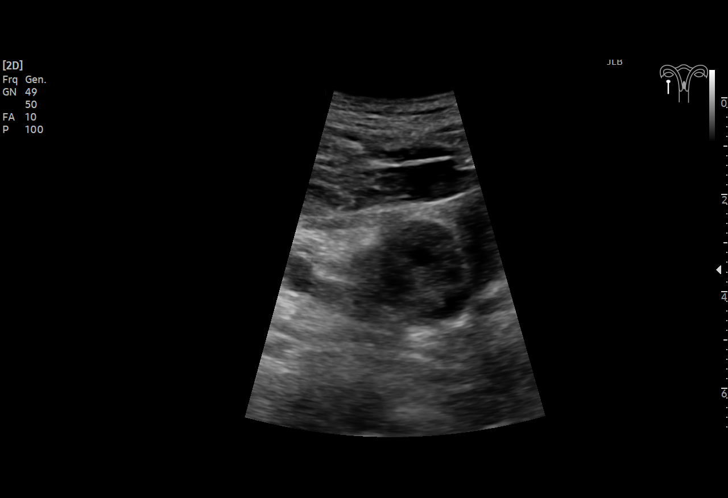
[im 31/81]
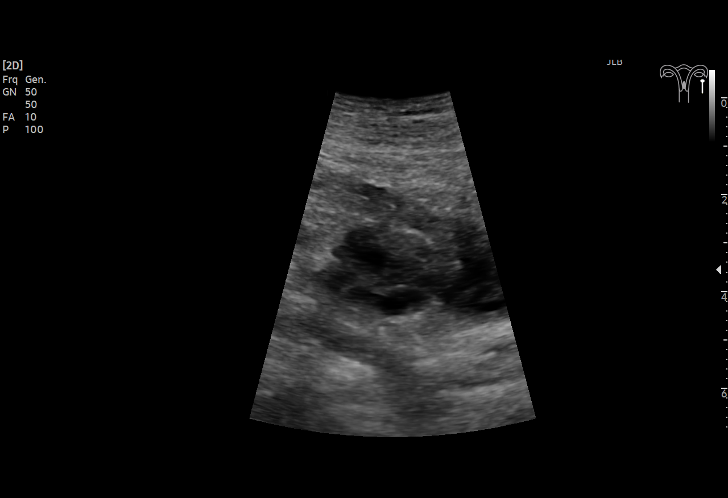
[im 34/81]
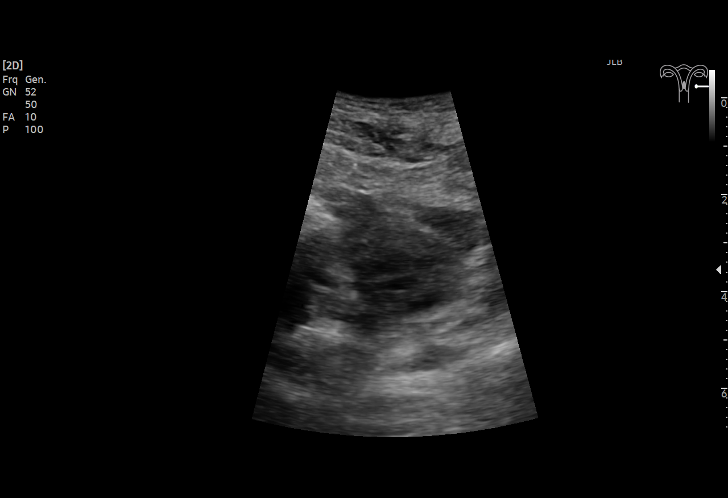
[im 41/81]
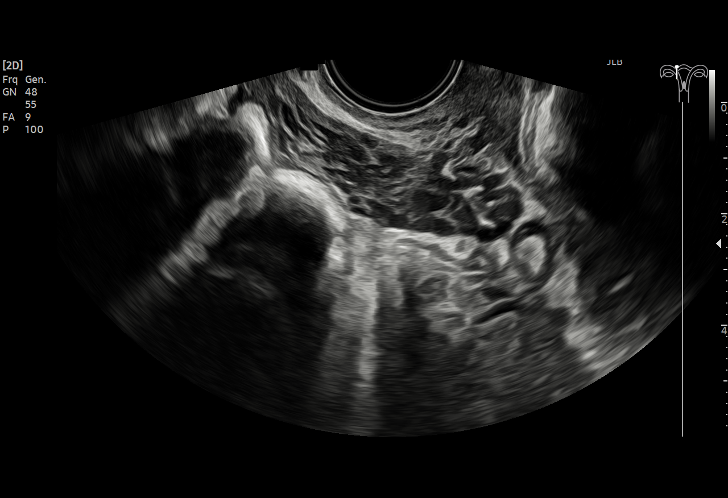
[im 47/81]
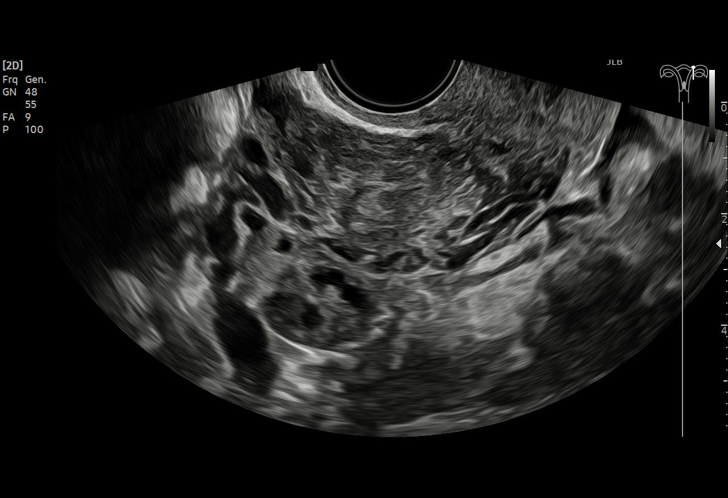
[im 51/81]
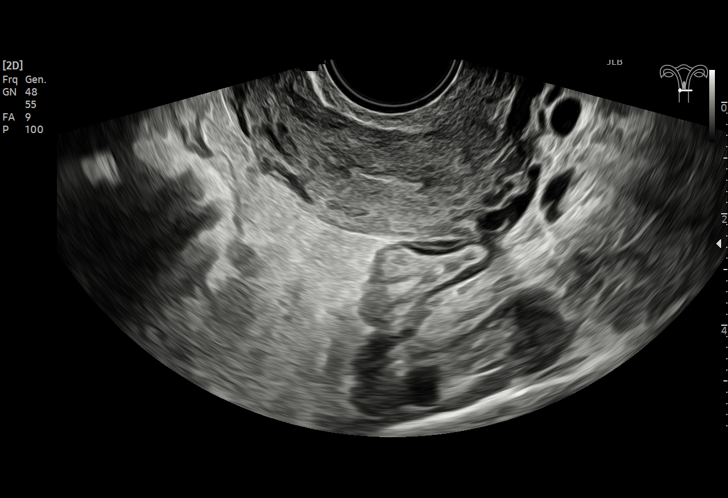
[im 57/81]
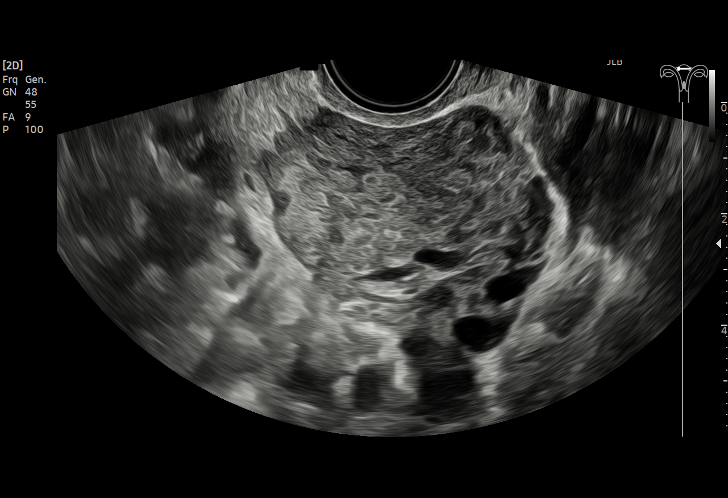
[im 64/81]
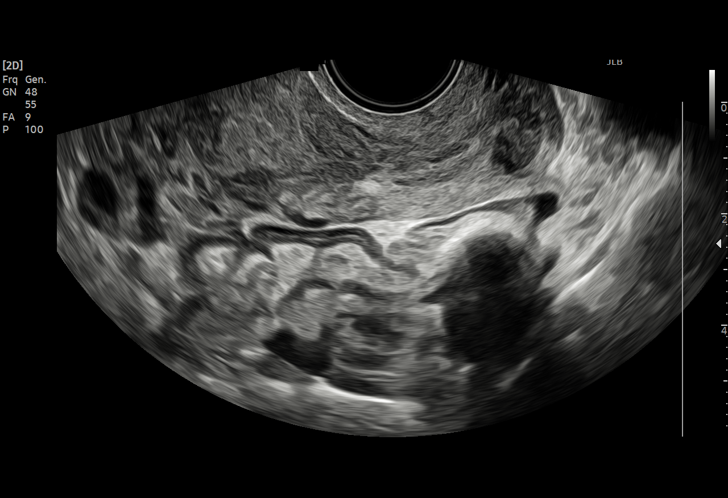
[im 67/81]
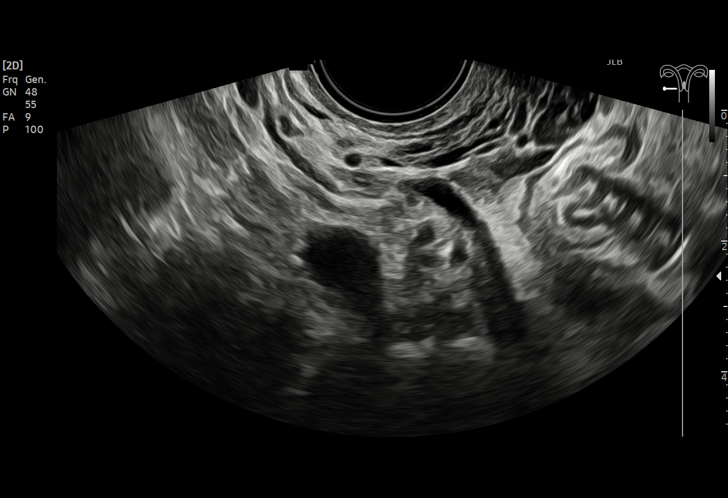
[im 74/81]
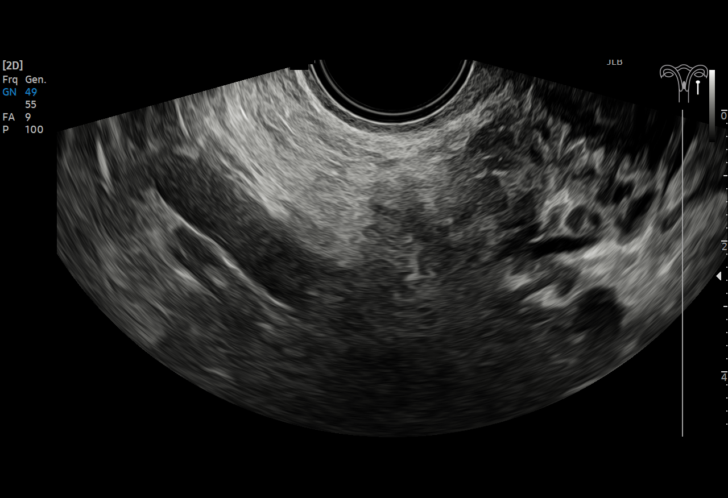
[im 81/81]
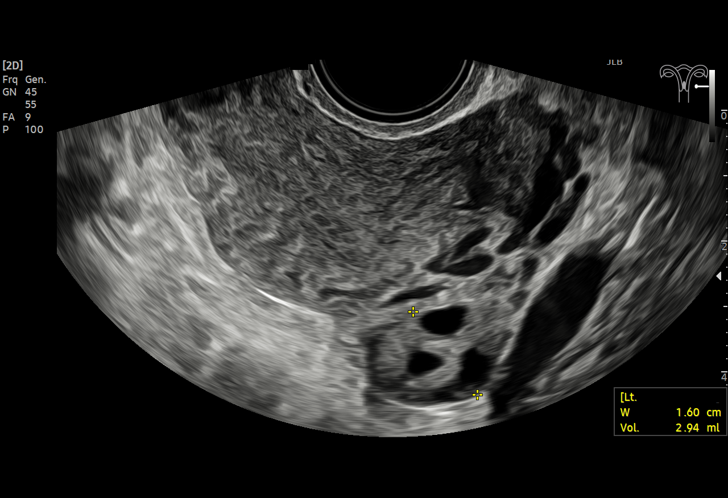

[15 of 25 positions shown; findings below may reference images not displayed]

FINDINGS: Uterus

Measurements: 7.9 x 3.7 x 5.8 cm = volume: 88.2 mL. No fibroids or
other mass visualized.

Endometrium

Thickness: 9 mm. There is a 2.1 x 2.1 x 2.6 cm submucosal fibroid or
polyp. There is a trace amount of fluid within the endometrial
canal.

Right ovary

Measurements: 2.2 x 2.0 x 1.7 cm = volume: 3.8 mL. Normal
appearance/no adnexal mass.

Left ovary

Measurements: 2.4 x 1.5 x 1.6 cm = volume: 2.9 mL. Normal
appearance/no adnexal mass.

Other findings

No abnormal free fluid.
IMPRESSION: 2.1 x 2.1 x 2.6 cm submucosal fibroid or endometrial polyp. Trace
fluid in the endometrial canal which could be blood products.

Normal ovaries.

## 2023-08-14 ENCOUNTER — Other Ambulatory Visit: Payer: Self-pay

## 2023-08-14 ENCOUNTER — Emergency Department
Admission: EM | Admit: 2023-08-14 | Discharge: 2023-08-14 | Disposition: A | Discharge status: Home / Self Care | Payer: Medicaid Other | Attending: Emergency Medicine | Admitting: Emergency Medicine

## 2023-08-14 ENCOUNTER — Emergency Department: Payer: Medicaid Other

## 2023-08-14 DIAGNOSIS — Z20822 Contact with and (suspected) exposure to covid-19: Secondary | ICD-10-CM | POA: Insufficient documentation

## 2023-08-14 DIAGNOSIS — R0789 Other chest pain: Secondary | ICD-10-CM | POA: Diagnosis not present

## 2023-08-14 DIAGNOSIS — R059 Cough, unspecified: Secondary | ICD-10-CM | POA: Diagnosis not present

## 2023-08-14 DIAGNOSIS — J029 Acute pharyngitis, unspecified: Secondary | ICD-10-CM | POA: Diagnosis not present

## 2023-08-14 DIAGNOSIS — R0602 Shortness of breath: Secondary | ICD-10-CM | POA: Diagnosis not present

## 2023-08-14 LAB — RESP PANEL BY RT-PCR (RSV, FLU A&B, COVID)  RVPGX2
Influenza A by PCR: NEGATIVE
Influenza B by PCR: NEGATIVE
Resp Syncytial Virus by PCR: NEGATIVE
SARS Coronavirus 2 by RT PCR: NEGATIVE

## 2023-08-14 LAB — GROUP A STREP BY PCR: Group A Strep by PCR: NOT DETECTED

## 2023-08-14 MED ORDER — DEXAMETHASONE SODIUM PHOSPHATE 10 MG/ML IJ SOLN
10.0000 mg | Freq: Once | INTRAMUSCULAR | Status: AC
  Administered 2023-08-14: 10 mg via INTRAMUSCULAR
  Filled 2023-08-14: qty 1

## 2023-08-14 MED ORDER — AMOXICILLIN-POT CLAVULANATE 875-125 MG PO TABS
1.0000 | ORAL_TABLET | Freq: Two times a day (BID) | ORAL | 0 refills | Status: AC
Start: 2023-08-14 — End: 2023-08-24
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
## 2023-08-14 NOTE — ED Notes (Signed)
 Patient discharged from ED by provider. Discharge instructions reviewed with patient and all questions answered. Patient ambulatory from ED in NAD.

## 2023-08-14 NOTE — Discharge Instructions (Signed)
 Your COVID, flu, RSV, and strep swabs were negative.  You are given medicine to help you feel better.  You may take the antibiotic as prescribed given your swollen lymph nodes.  Please return for any new, worsening, or change in symptoms or other concerns.  It was a pleasure caring for you today.

## 2023-08-14 NOTE — ED Provider Notes (Signed)
 [facility] Provider Note    Event Date/Time   First MD Initiated Contact with Patient 08/14/23 1146     (approximate)   History   Sore Throat   HPI  Erica Ramirez is a 38 y.o. female with no reported past medical history presents today for evaluation of sore throat that began yesterday.  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  Patient reports that she has pain with swallowing but is able to handle her secretions and has been able to eat and drink.  She has noticed swollen lymph nodes to the right side of her neck.  She denies any nuchal rigidity.  She has not had any fevers but reports chills.  She reports very mild cough.  She notes that she has had a hoarse voice.   Patient Active Problem List   Diagnosis Date Noted   Anemia during pregnancy in third trimester 11/14/2020   Supervision of other normal pregnancy, antepartum 07/11/2020          Physical Exam   Triage Vital Signs: ED Triage Vitals  Encounter Vitals Group     BP 08/14/23 1038 121/77     Systolic BP Percentile --      Diastolic BP Percentile --      Pulse Rate 08/14/23 1038 73     Resp 08/14/23 1038 20     Temp 08/14/23 1038 98.3 F (36.8 C)     Temp Source 08/14/23 1038 Oral     SpO2 08/14/23 1038 100 %     Weight --      Height --      Head Circumference --      Peak Flow --      Pain Score 08/14/23 1034 8     Pain Loc --      Pain Education --      Exclude from Growth Chart --     Most recent vital signs: Vitals:   08/14/23 1038  BP: 121/77  Pulse: 73  Resp: 20  Temp: 98.3 F (36.8 C)  SpO2: 100%    Physical Exam Vitals and nursing note reviewed.  Constitutional:      General: Awake and alert. No acute distress.    Appearance: Normal appearance. The patient is normal weight.  HENT:     Head: Normocephalic and atraumatic.      Mouth: Mucous membranes are moist. Uvula midline.  No tonsillar exudate.  Mild oropharyngeal erythema. No soft palate fluctuance.  No trismus.  Hoarse voice, though no hot potato voice.  No sublingual swelling.  Right sided tender cervical lymphadenopathy.  No nuchal rigidity Eyes:     General: PERRL. Normal EOMs        Right eye: No discharge.        Left eye: No discharge.     Conjunctiva/sclera: Conjunctivae normal.  Cardiovascular:     Rate and Rhythm: Normal rate and regular rhythm.     Pulses: Normal pulses.  Pulmonary:     Effort: Pulmonary effort is normal. No respiratory distress.     Breath sounds: Normal breath sounds.  Abdominal:     Abdomen is soft. There is no abdominal tenderness. No rebound or guarding. No distention. Musculoskeletal:        General: No swelling. Normal range of motion.     Cervical back: Normal range of motion and neck supple.  Skin:    General: Skin is warm and dry.     Capillary Refill: Capillary refill takes less than 2 seconds.     Findings: No rash.  Neurological:     Mental Status: The patient is awake and alert.      ED Results / Procedures / Treatments   Labs (all labs ordered are listed, but only abnormal results are displayed) Labs Reviewed  GROUP A STREP BY PCR  RESP PANEL BY RT-PCR (RSV, FLU A&B, COVID)  RVPGX2     EKG     RADIOLOGY I independently reviewed and interpreted imaging and agree with radiologists findings.     PROCEDURES:  Critical Care performed:   Procedures   MEDICATIONS ORDERED IN ED: Medications  dexamethasone  (DECADRON ) injection 10 mg (10 mg Intramuscular Given 08/14/23 1217)     IMPRESSION / MDM / ASSESSMENT AND PLAN / ED COURSE  I reviewed the triage vital signs and the nursing notes.   Differential diagnosis includes, but is not limited to, viral pharyngitis, strep pharyngitis, COVID-19, other URI.  Patient is awake and alert stable and afebrile.  She has normal oxygen saturation 100%  on room air.  Her uvula is midline, no tonsillar exudate, no trismus, no hot potato voice, do not suspect retropharyngeal peritonsillar.  She does have right-sided tender cervical lymphadenopathy.  Given her tender lymphadenopathy, lack of cough, will treat with abx which patient is in agreement with. Swabs and CXR obtained in triage are negative.  She was given a dose of Decadron  in the emergency department to help with her symptoms, declines giving pregnancy test prior to this and reports no chance of pregnancy, though understands the risks to the fetus if she is indeed pregnant and takes his medications.  She is certain that she is not pregnant.  We discussed return precautions and the importance close outpatient follow-up.  Patient understands and agrees with plan.  She was discharged in stable condition.  Patient's presentation is most consistent with acute complicated illness / injury requiring diagnostic workup.    FINAL CLINICAL IMPRESSION(S) / ED DIAGNOSES   Final diagnoses:  Pharyngitis, unspecified etiology     Rx / DC Orders   ED Discharge Orders          Ordered    amoxicillin -clavulanate (AUGMENTIN ) 875-125 MG tablet  2 times daily        08/14/23 1205             Note:  This document was prepared using Dragon voice recognition software and may include unintentional dictation errors.   Daeveon Zweber E, PA-C 08/14/23 1243    Jacolyn Pae, MD 08/14/23 1845

## 2023-08-17 ENCOUNTER — Other Ambulatory Visit: Payer: Self-pay

## 2023-08-17 ENCOUNTER — Emergency Department
Admission: EM | Admit: 2023-08-17 | Discharge: 2023-08-17 | Disposition: A | Discharge status: Home / Self Care | Payer: Medicaid Other | Attending: Emergency Medicine | Admitting: Emergency Medicine

## 2023-08-17 DIAGNOSIS — B9789 Other viral agents as the cause of diseases classified elsewhere: Secondary | ICD-10-CM | POA: Diagnosis not present

## 2023-08-17 DIAGNOSIS — R059 Cough, unspecified: Secondary | ICD-10-CM | POA: Diagnosis not present

## 2023-08-17 DIAGNOSIS — Z20822 Contact with and (suspected) exposure to covid-19: Secondary | ICD-10-CM | POA: Diagnosis not present

## 2023-08-17 DIAGNOSIS — J069 Acute upper respiratory infection, unspecified: Secondary | ICD-10-CM | POA: Diagnosis not present

## 2023-08-17 LAB — RESP PANEL BY RT-PCR (RSV, FLU A&B, COVID)  RVPGX2
Influenza A by PCR: NEGATIVE
Influenza B by PCR: NEGATIVE
Resp Syncytial Virus by PCR: NEGATIVE
SARS Coronavirus 2 by RT PCR: NEGATIVE

## 2023-08-17 MED ORDER — PREDNISONE 10 MG PO TABS: ORAL_TABLET | ORAL | 0 refills | Status: AC

## 2023-08-17 MED ORDER — PSEUDOEPH-BROMPHEN-DM 30-2-10 MG/5ML PO SYRP: 5.0000 mL | ORAL_SOLUTION | Freq: Four times a day (QID) | ORAL | 0 refills | Status: AC | PRN

## 2023-08-17 NOTE — ED Provider Notes (Signed)
 [facility] Provider Note    Event Date/Time   First MD Initiated Contact with Patient 08/17/23 1441     (approximate)   History   Cough   HPI  Erica Ramirez is a 38 y.o. female presents to the ED with complaint of continued cough.  Patient states that she was seen on 08/14/2023 at which time she was tested negative for strep, COVID, influenza and RSV.  Cough has continued to be an issue waking her up at night.  When she was seen on 08/14/2023 she was prescribed an antibiotic which she states is not helping with symptoms.     Physical Exam   Triage Vital Signs: ED Triage Vitals  Encounter Vitals Group     BP 08/17/23 1335 117/65     Systolic BP Percentile --      Diastolic BP Percentile --      Pulse Rate 08/17/23 1335 87     Resp 08/17/23 1335 16     Temp 08/17/23 1335 98.6 F (37 C)     Temp src --      SpO2 08/17/23 1335 100 %     Weight 08/17/23 1334 160 lb (72.6 kg)     Height 08/17/23 1334 5' 7 (1.702 m)     Head Circumference --      Peak Flow --      Pain Score 08/17/23 1334 0     Pain Loc --      Pain Education --      Exclude from Growth Chart --     Most recent vital signs: Vitals:   08/17/23 1335  BP: 117/65  Pulse: 87  Resp: 16  Temp: 98.6 F (37 C)  SpO2: 100%     General: Awake, no distress.  Able to talk in complete sentences without any difficulty. CV:  Good peripheral perfusion.  Heart regular rate and rhythm. Resp:  Normal effort.  Lungs are clear bilaterally with a coarse cough noted during exam. Abd:  No distention.  Other:     ED Results / Procedures / Treatments   Labs (all labs ordered are listed, but only abnormal results are displayed) Labs Reviewed  RESP PANEL BY RT-PCR (RSV, FLU A&B, COVID)  [id number]      PROCEDURES:  Critical Care performed:   Procedures   MEDICATIONS ORDERED IN ED: Medications - No data to display   IMPRESSION / MDM / ASSESSMENT AND PLAN / ED COURSE  I  reviewed the triage vital signs and the nursing notes.   Differential diagnosis includes, but is not limited to, COVID, influenza, RSV, viral URI, bronchitis.  38 year old female presents to the ED with continued cough and congestion after being seen in the emergency department on 08/14/2023.  Patient reports that she does not have any medication for cough which seems to be also associated with congestion.  A prescription for Bromfed DM and prednisone  30 mg daily for 5 days was sent to the pharmacy.  Patient is encouraged to continue with her previously prescribed antibiotic to cover for bronchitis.  She was made aware today that her respiratory panel again is negative.  She has to follow-up with her PCP or urgent care if any continued problems.      Patient's presentation is most consistent with acute complicated illness / injury requiring diagnostic workup.  FINAL CLINICAL IMPRESSION(S) / ED DIAGNOSES   Final diagnoses:  Viral URI with cough     Rx / DC Orders  ED Discharge Orders          Ordered    brompheniramine-pseudoephedrine-DM 30-2-10 MG/5ML syrup  4 times daily PRN        08/17/23 1448    predniSONE  (DELTASONE ) 10 MG tablet        08/17/23 1448             Note:  This document was prepared using Dragon voice recognition software and may include unintentional dictation errors.   Saunders Shona CROME, PA-C 08/17/23 1457    Arlander Charleston, MD 08/17/23 1500

## 2023-08-17 NOTE — ED Triage Notes (Signed)
 Pt to ED for continued cough, seen on 1/11 for same. Tested neg for strep, covid, flu, RSV. Dg chest resulted from 1/11 as well.

## 2023-08-17 NOTE — Discharge Instructions (Signed)
 Follow up with Urgent Care if any continued problems or concerns.  Take Bromofed DM as needed for cough and congestion.  Prednisone is once a day for the next 5 days.  Increase fluids.
